# Patient Record
Sex: Male | Born: 1975 | Race: White | Hispanic: No | Smoking: Never smoker
Health system: Southern US, Community
[De-identification: ages and names within clinical notes are randomized; demographics above are authoritative.]

## PROBLEM LIST (undated history)

## (undated) DIAGNOSIS — F419 Anxiety disorder, unspecified: Secondary | ICD-10-CM

## (undated) DIAGNOSIS — K219 Gastro-esophageal reflux disease without esophagitis: Secondary | ICD-10-CM

## (undated) DIAGNOSIS — G4700 Insomnia, unspecified: Secondary | ICD-10-CM

## (undated) DIAGNOSIS — IMO0001 Reserved for inherently not codable concepts without codable children: Secondary | ICD-10-CM

## (undated) DIAGNOSIS — I10 Essential (primary) hypertension: Secondary | ICD-10-CM

## (undated) HISTORY — DX: Essential (primary) hypertension: I10

## (undated) HISTORY — PX: KNEE SURGERY: SHX244

## (undated) HISTORY — DX: Reserved for inherently not codable concepts without codable children: IMO0001

## (undated) HISTORY — DX: Gastro-esophageal reflux disease without esophagitis: K21.9

## (undated) HISTORY — DX: Insomnia, unspecified: G47.00

## (undated) HISTORY — DX: Anxiety disorder, unspecified: F41.9

---

## 2010-06-04 ENCOUNTER — Emergency Department (HOSPITAL_COMMUNITY): Admission: EM | Admit: 2010-06-04 | Discharge: 2010-06-04 | Payer: Self-pay | Admitting: Emergency Medicine

## 2011-01-08 ENCOUNTER — Encounter: Payer: Self-pay | Admitting: Family Medicine

## 2011-09-22 ENCOUNTER — Other Ambulatory Visit: Payer: Self-pay | Admitting: Family Medicine

## 2011-09-22 ENCOUNTER — Ambulatory Visit (HOSPITAL_COMMUNITY)
Admission: RE | Admit: 2011-09-22 | Discharge: 2011-09-22 | Disposition: A | Discharge status: Home / Self Care | Payer: BC Managed Care – PPO | Source: Ambulatory Visit | Attending: Family Medicine | Admitting: Family Medicine

## 2011-09-22 DIAGNOSIS — R0781 Pleurodynia: Secondary | ICD-10-CM

## 2011-09-22 DIAGNOSIS — R109 Unspecified abdominal pain: Secondary | ICD-10-CM | POA: Insufficient documentation

## 2011-09-22 DIAGNOSIS — R079 Chest pain, unspecified: Secondary | ICD-10-CM | POA: Insufficient documentation

## 2012-09-09 ENCOUNTER — Other Ambulatory Visit: Payer: Self-pay | Admitting: Family Medicine

## 2012-09-09 DIAGNOSIS — R945 Abnormal results of liver function studies: Secondary | ICD-10-CM

## 2012-09-12 ENCOUNTER — Ambulatory Visit (HOSPITAL_COMMUNITY)
Admission: RE | Admit: 2012-09-12 | Discharge: 2012-09-12 | Disposition: A | Discharge status: Home / Self Care | Payer: BC Managed Care – PPO | Source: Ambulatory Visit | Attending: Family Medicine | Admitting: Family Medicine

## 2012-09-12 ENCOUNTER — Other Ambulatory Visit: Payer: Self-pay | Admitting: Family Medicine

## 2012-09-12 DIAGNOSIS — R945 Abnormal results of liver function studies: Secondary | ICD-10-CM

## 2012-09-12 DIAGNOSIS — K7689 Other specified diseases of liver: Secondary | ICD-10-CM | POA: Insufficient documentation

## 2012-09-12 DIAGNOSIS — R748 Abnormal levels of other serum enzymes: Secondary | ICD-10-CM | POA: Insufficient documentation

## 2012-10-10 ENCOUNTER — Ambulatory Visit (INDEPENDENT_AMBULATORY_CARE_PROVIDER_SITE_OTHER): Payer: BC Managed Care – PPO | Admitting: Otolaryngology

## 2012-10-10 DIAGNOSIS — J343 Hypertrophy of nasal turbinates: Secondary | ICD-10-CM

## 2012-10-10 DIAGNOSIS — J310 Chronic rhinitis: Secondary | ICD-10-CM

## 2012-10-10 DIAGNOSIS — R51 Headache: Secondary | ICD-10-CM

## 2012-11-21 ENCOUNTER — Ambulatory Visit (INDEPENDENT_AMBULATORY_CARE_PROVIDER_SITE_OTHER): Payer: BC Managed Care – PPO | Admitting: Otolaryngology

## 2012-11-21 DIAGNOSIS — J310 Chronic rhinitis: Secondary | ICD-10-CM

## 2012-11-21 DIAGNOSIS — J343 Hypertrophy of nasal turbinates: Secondary | ICD-10-CM

## 2013-04-23 ENCOUNTER — Encounter: Payer: Self-pay | Admitting: *Deleted

## 2013-04-24 ENCOUNTER — Ambulatory Visit: Payer: Self-pay | Admitting: Family Medicine

## 2013-04-25 ENCOUNTER — Ambulatory Visit (INDEPENDENT_AMBULATORY_CARE_PROVIDER_SITE_OTHER): Payer: BC Managed Care – PPO | Admitting: Nurse Practitioner

## 2013-04-25 ENCOUNTER — Encounter: Payer: Self-pay | Admitting: Nurse Practitioner

## 2013-04-25 VITALS — BP 150/108 | HR 80 | Ht 74.0 in | Wt 278.0 lb

## 2013-04-25 DIAGNOSIS — F411 Generalized anxiety disorder: Secondary | ICD-10-CM

## 2013-04-25 DIAGNOSIS — F419 Anxiety disorder, unspecified: Secondary | ICD-10-CM

## 2013-04-25 DIAGNOSIS — I10 Essential (primary) hypertension: Secondary | ICD-10-CM

## 2013-04-25 MED ORDER — CITALOPRAM HYDROBROMIDE 40 MG PO TABS: 40.0000 mg | ORAL_TABLET | Freq: Every day | ORAL | Status: DC

## 2013-04-25 MED ORDER — LORAZEPAM 1 MG PO TABS: ORAL_TABLET | ORAL | Status: DC

## 2013-04-25 NOTE — Patient Instructions (Signed)
Sodium-Controlled Diet  Sodium is a mineral. It is found in many foods. Sodium may be found naturally or added during the making of a food. The most common form of sodium is salt, which is made up of sodium and chloride. Reducing your sodium intake involves changing your eating habits.  The following guidelines will help you reduce the sodium in your diet:   Stop using the salt shaker.   Use salt sparingly in cooking and baking.   Substitute with sodium-free seasonings and spices.   Do not use a salt substitute (potassium chloride) without your caregiver's permission.   Include a variety of fresh, unprocessed foods in your diet.   Limit the use of processed and convenience foods that are high in sodium.  USE THE FOLLOWING FOODS SPARINGLY:  Breads/Starches   Commercial bread stuffing, commercial pancake or waffle mixes, coating mixes. Waffles. Croutons. Prepared (boxed or frozen) potato, rice, or noodle mixes that contain salt or sodium. Salted French fries or hash browns. Salted popcorn, breads, crackers, chips, or snack foods.  Vegetables   Vegetables canned with salt or prepared in cream, butter, or cheese sauces. Sauerkraut. Tomato or vegetable juices canned with salt.   Fresh vegetables are allowed if rinsed thoroughly.  Fruit   Fruit is okay to eat.  Meat and Meat Substitutes   Salted or smoked meats, such as bacon or Canadian bacon, chipped or corned beef, hot dogs, salt pork, luncheon meats, pastrami, ham, or sausage. Canned or smoked fish, poultry, or meat. Processed cheese or cheese spreads, blue or Roquefort cheese. Battered or frozen fish products. Prepared spaghetti sauce. Baked beans. Reuben sandwiches. Salted nuts. Caviar.  Milk   Limit buttermilk to 1 cup per week.  Soups and Combination Foods    Bouillon cubes, canned or dried soups, broth, consomm. Convenience (frozen or packaged) dinners with more than 600 mg sodium. Pot pies, pizza, Asian food, fast food cheeseburgers, and specialty sandwiches.  Desserts and Sweets   Regular (salted) desserts, pie, commercial fruit snack pies, commercial snack cakes, canned puddings.   Eat desserts and sweets in moderation.  Fats and Oils   Gravy mixes or canned gravy. No more than 1 to 2 tbs of salad dressing. Chip dips.   Eat fats and oils in moderation.  Beverages   See those listed under the vegetables and milk groups.  Condiments   Ketchup, mustard, meat sauces, salsa, regular (salted) and lite soy sauce or mustard. Dill pickles, olives, meat tenderizer. Prepared horseradish or pickle relish. Dutch-processed cocoa. Baking powder or baking soda used medicinally. Worcestershire sauce. "Light" salt. Salt substitute, unless approved by your caregiver.  Document Released: 05/26/2002 Document Revised: 02/26/2012 Document Reviewed: 12/27/2009  Exit[employer] Patient Information 2013 [employer].

## 2013-04-29 ENCOUNTER — Encounter: Payer: Self-pay | Admitting: Nurse Practitioner

## 2013-04-29 DIAGNOSIS — I10 Essential (primary) hypertension: Secondary | ICD-10-CM | POA: Insufficient documentation

## 2013-04-29 DIAGNOSIS — F419 Anxiety disorder, unspecified: Secondary | ICD-10-CM | POA: Insufficient documentation

## 2013-04-29 NOTE — Assessment & Plan Note (Signed)
Continue lorazepam and Celexa as directed.

## 2013-04-29 NOTE — Assessment & Plan Note (Signed)
Continue current meds, recommend nurse visit next week for BP recheck.

## 2013-04-29 NOTE — Progress Notes (Signed)
Subjective:  Presents for routine followup. Sleeping well. Celexa doing well. Also taking Ativan for his anxiety which helps keep his anxiety at a tolerable level for him to function. Had an episode of mid abdominal pain last night, took a Zantac and 20 minutes later improved. Took a second Zantac later, symptoms have resolved. Bowels normal limit. No fever. Did have some slight nausea, no vomiting. Rare reflux depending on his diet. No chest pain shortness of breath or edema. Compliant with medications.  Objective:   BP 150/108  Pulse 80  Ht 6\' 2"  (1.88 m)  Wt 278 lb (126.1 kg)  BMI 35.68 kg/m2 NAD. Alert, oriented. Lungs clear. Heart regular rate rhythm. Lower extremities no edema. BP on recheck sitting 148/106. Abdomen soft nondistended nontender.  Assessment:Anxiety  Hypertension  Plan: Meds ordered this encounter  Medications  . fluticasone (FLONASE) 50 MCG/ACT nasal spray    Sig: Place 2 sprays into the nose daily.  . citalopram (CELEXA) 40 MG tablet    Sig: Take 1 tablet (40 mg total) by mouth daily.    Dispense:  30 tablet    Refill:  5    Order Specific Question:  Supervising Provider    Answer:  Merlyn Albert [[id number]]  . LORazepam (ATIVAN) 1 MG tablet    Sig: One po TID prn anxiety    Dispense:  90 tablet    Refill:  5    5 monthly refills    Order Specific Question:  Supervising Provider    Answer:  Riccardo Dubin   Recommend nurse visit next week for recheck of his blood pressure. Also recommend preventive health physical. Recheck 6 months, call back sooner if any problems.

## 2013-05-05 ENCOUNTER — Other Ambulatory Visit: Payer: Self-pay | Admitting: Nurse Practitioner

## 2013-10-20 ENCOUNTER — Other Ambulatory Visit: Payer: Self-pay | Admitting: Family Medicine

## 2013-10-20 ENCOUNTER — Other Ambulatory Visit: Payer: Self-pay | Admitting: Nurse Practitioner

## 2013-10-22 ENCOUNTER — Telehealth: Payer: Self-pay | Admitting: Family Medicine

## 2013-10-22 MED ORDER — CITALOPRAM HYDROBROMIDE 40 MG PO TABS: ORAL_TABLET | ORAL | Status: DC

## 2013-10-22 NOTE — Telephone Encounter (Signed)
Medication sent to pharmacy. Patient was notified.  

## 2013-10-22 NOTE — Telephone Encounter (Signed)
Patient states he was unaware he would need an office visit to have his medications refilled.  States he has never had to be seen prior to refills.  However, patient has an appointment on Monday November 10/27/2013 @ 1:30pm.  He needs a refill on citalopram (CELEXA) 40 MG tablet [employer]

## 2013-10-27 ENCOUNTER — Ambulatory Visit (INDEPENDENT_AMBULATORY_CARE_PROVIDER_SITE_OTHER): Payer: BC Managed Care – PPO | Admitting: Family Medicine

## 2013-10-27 ENCOUNTER — Encounter: Payer: Self-pay | Admitting: Family Medicine

## 2013-10-27 VITALS — BP 122/90 | Ht 74.0 in | Wt 273.5 lb

## 2013-10-27 DIAGNOSIS — J3089 Other allergic rhinitis: Secondary | ICD-10-CM

## 2013-10-27 DIAGNOSIS — I10 Essential (primary) hypertension: Secondary | ICD-10-CM

## 2013-10-27 DIAGNOSIS — K7689 Other specified diseases of liver: Secondary | ICD-10-CM

## 2013-10-27 DIAGNOSIS — F411 Generalized anxiety disorder: Secondary | ICD-10-CM

## 2013-10-27 DIAGNOSIS — F419 Anxiety disorder, unspecified: Secondary | ICD-10-CM

## 2013-10-27 DIAGNOSIS — J309 Allergic rhinitis, unspecified: Secondary | ICD-10-CM

## 2013-10-27 DIAGNOSIS — K760 Fatty (change of) liver, not elsewhere classified: Secondary | ICD-10-CM

## 2013-10-27 DIAGNOSIS — Z79899 Other long term (current) drug therapy: Secondary | ICD-10-CM

## 2013-10-27 MED ORDER — CITALOPRAM HYDROBROMIDE 40 MG PO TABS: ORAL_TABLET | ORAL | Status: DC

## 2013-10-27 MED ORDER — LORAZEPAM 1 MG PO TABS: ORAL_TABLET | ORAL | Status: DC

## 2013-10-27 MED ORDER — ENALAPRIL MALEATE 10 MG PO TABS: ORAL_TABLET | ORAL | Status: DC

## 2013-10-27 MED ORDER — FLUTICASONE PROPIONATE 50 MCG/ACT NA SUSP: 2.0000 | Freq: Every day | NASAL | Status: DC

## 2013-10-27 NOTE — Progress Notes (Signed)
  Subjective:    Patient ID: Martin Sellers, male    DOB: Mar 30, 1976, 36 y.o.   MRN: [id number]  Hypertension This is a chronic problem. The current episode started more than 1 year ago. The problem is unchanged. The problem is controlled. There are no associated agents to hypertension. There are no known risk factors for coronary artery disease. Treatments tried: enalapril. The current treatment provides moderate improvement. There are no compliance problems.    Patient also has a history of chronic anxiety. He uses both Celexa and anxiolytics for this. He states it definitely helps him.  Patient has history of allergic rhinitis. He uses Flonase year-round. He states that this has controlled his symptoms. No obvious side effects from it.  History of elevated liver function tests. Workup in the past revealed fatty liver. Patient also has an excessive alcohol intake at that time which she is since cut back. Liver enzymes have since normalized.  Patient reports fair compliance with diet. Not exercising much at this time. Unfortunately gaining weight.  Review of Systems No chest pain no back pain no abdominal pain no change in bowel habits or urinary habits    Objective:   Physical Exam Alert obesity present. HEENT mom his congestion blood pressure improved on repeat lungs clear. Heart regular in rhythm. Abdomen benign.       Assessment & Plan:  Impression 1 hypertension good control discussed #2 perennial rhinitis stable. #3 history of fatty liver and elevated liver functions tests status uncertain. #4 obesity discussed. #5 chronic anxiety discussed. Plan maintain medications. Diet exercise discussed. Appropriate blood work. Check every 6 months. WSL

## 2013-10-27 NOTE — Patient Instructions (Signed)
Try to start exercising regularly. We will notify you of your lab reslults

## 2014-03-08 ENCOUNTER — Encounter (HOSPITAL_COMMUNITY): Payer: Self-pay | Admitting: Emergency Medicine

## 2014-03-08 ENCOUNTER — Emergency Department (HOSPITAL_COMMUNITY): Payer: BC Managed Care – PPO

## 2014-03-08 ENCOUNTER — Emergency Department (HOSPITAL_COMMUNITY)
Admission: EM | Admit: 2014-03-08 | Discharge: 2014-03-08 | Disposition: A | Discharge status: Home / Self Care | Payer: BC Managed Care – PPO | Attending: Emergency Medicine | Admitting: Emergency Medicine

## 2014-03-08 DIAGNOSIS — IMO0002 Reserved for concepts with insufficient information to code with codable children: Secondary | ICD-10-CM | POA: Insufficient documentation

## 2014-03-08 DIAGNOSIS — I10 Essential (primary) hypertension: Secondary | ICD-10-CM | POA: Insufficient documentation

## 2014-03-08 DIAGNOSIS — F411 Generalized anxiety disorder: Secondary | ICD-10-CM | POA: Insufficient documentation

## 2014-03-08 DIAGNOSIS — Z79899 Other long term (current) drug therapy: Secondary | ICD-10-CM | POA: Insufficient documentation

## 2014-03-08 DIAGNOSIS — R109 Unspecified abdominal pain: Secondary | ICD-10-CM | POA: Insufficient documentation

## 2014-03-08 DIAGNOSIS — R079 Chest pain, unspecified: Secondary | ICD-10-CM | POA: Insufficient documentation

## 2014-03-08 DIAGNOSIS — R12 Heartburn: Secondary | ICD-10-CM | POA: Insufficient documentation

## 2014-03-08 DIAGNOSIS — Z8719 Personal history of other diseases of the digestive system: Secondary | ICD-10-CM | POA: Insufficient documentation

## 2014-03-08 DIAGNOSIS — E860 Dehydration: Secondary | ICD-10-CM | POA: Insufficient documentation

## 2014-03-08 DIAGNOSIS — R110 Nausea: Secondary | ICD-10-CM | POA: Insufficient documentation

## 2014-03-08 LAB — CBC WITH DIFFERENTIAL/PLATELET
Basophils Absolute: 0 10*3/uL (ref 0.0–0.1)
Basophils Relative: 1 % (ref 0–1)
EOS ABS: 0.1 10*3/uL (ref 0.0–0.7)
EOS PCT: 2 % (ref 0–5)
HEMATOCRIT: 45.7 % (ref 39.0–52.0)
Hemoglobin: 15.4 g/dL (ref 13.0–17.0)
LYMPHS ABS: 1.2 10*3/uL (ref 0.7–4.0)
LYMPHS PCT: 17 % (ref 12–46)
MCH: 31 pg (ref 26.0–34.0)
MCHC: 33.7 g/dL (ref 30.0–36.0)
MCV: 92 fL (ref 78.0–100.0)
MONO ABS: 0.5 10*3/uL (ref 0.1–1.0)
Monocytes Relative: 7 % (ref 3–12)
Neutro Abs: 5 10*3/uL (ref 1.7–7.7)
Neutrophils Relative %: 74 % (ref 43–77)
Platelets: 266 10*3/uL (ref 150–400)
RBC: 4.97 MIL/uL (ref 4.22–5.81)
RDW: 12.2 % (ref 11.5–15.5)
WBC: 6.9 10*3/uL (ref 4.0–10.5)

## 2014-03-08 LAB — CK: Total CK: 560 U/L — ABNORMAL HIGH (ref 7–232)

## 2014-03-08 LAB — URINALYSIS, ROUTINE W REFLEX MICROSCOPIC
Bilirubin Urine: NEGATIVE
Glucose, UA: NEGATIVE mg/dL
Hgb urine dipstick: NEGATIVE
KETONES UR: NEGATIVE mg/dL
LEUKOCYTES UA: NEGATIVE
NITRITE: NEGATIVE
PH: 6 (ref 5.0–8.0)
Protein, ur: NEGATIVE mg/dL
SPECIFIC GRAVITY, URINE: 1.025 (ref 1.005–1.030)
Urobilinogen, UA: 0.2 mg/dL (ref 0.0–1.0)

## 2014-03-08 LAB — I-STAT CG4 LACTIC ACID, ED
LACTIC ACID, VENOUS: 0.82 mmol/L (ref 0.5–2.2)
Lactic Acid, Venous: 3 mmol/L — ABNORMAL HIGH (ref 0.5–2.2)

## 2014-03-08 LAB — I-STAT TROPONIN, ED
TROPONIN I, POC: 0.01 ng/mL (ref 0.00–0.08)
Troponin i, poc: 0 ng/mL (ref 0.00–0.08)

## 2014-03-08 LAB — COMPREHENSIVE METABOLIC PANEL
ALT: 67 U/L — ABNORMAL HIGH (ref 0–53)
AST: 47 U/L — ABNORMAL HIGH (ref 0–37)
Albumin: 4.2 g/dL (ref 3.5–5.2)
Alkaline Phosphatase: 61 U/L (ref 39–117)
BUN: 12 mg/dL (ref 6–23)
CALCIUM: 10 mg/dL (ref 8.4–10.5)
CO2: 25 meq/L (ref 19–32)
CREATININE: 0.9 mg/dL (ref 0.50–1.35)
Chloride: 100 mEq/L (ref 96–112)
GLUCOSE: 128 mg/dL — AB (ref 70–99)
Potassium: 4.3 mEq/L (ref 3.7–5.3)
Sodium: 141 mEq/L (ref 137–147)
TOTAL PROTEIN: 7.4 g/dL (ref 6.0–8.3)
Total Bilirubin: 1.2 mg/dL (ref 0.3–1.2)

## 2014-03-08 LAB — LIPASE, BLOOD: LIPASE: 37 U/L (ref 11–59)

## 2014-03-08 MED ORDER — OXYCODONE-ACETAMINOPHEN 5-325 MG PO TABS
1.0000 | ORAL_TABLET | Freq: Once | ORAL | Status: AC
  Administered 2014-03-08: 1 via ORAL
  Filled 2014-03-08: qty 1

## 2014-03-08 MED ORDER — PANTOPRAZOLE SODIUM 40 MG IV SOLR
40.0000 mg | Freq: Once | INTRAVENOUS | Status: AC
  Administered 2014-03-08: 40 mg via INTRAVENOUS
  Filled 2014-03-08: qty 40

## 2014-03-08 MED ORDER — ONDANSETRON 8 MG PO TBDP
8.0000 mg | ORAL_TABLET | Freq: Once | ORAL | Status: AC
  Administered 2014-03-08: 8 mg via ORAL
  Filled 2014-03-08: qty 1

## 2014-03-08 MED ORDER — IOHEXOL 300 MG/ML  SOLN
50.0000 mL | Freq: Once | INTRAMUSCULAR | Status: AC | PRN
  Administered 2014-03-08: 50 mL via ORAL

## 2014-03-08 MED ORDER — SODIUM CHLORIDE 0.9 % IV BOLUS (SEPSIS): 500.0000 mL | Freq: Once | INTRAVENOUS | Status: DC

## 2014-03-08 MED ORDER — MORPHINE SULFATE 4 MG/ML IJ SOLN
4.0000 mg | Freq: Once | INTRAMUSCULAR | Status: AC
  Administered 2014-03-08: 4 mg via INTRAVENOUS

## 2014-03-08 MED ORDER — IOHEXOL 300 MG/ML  SOLN
100.0000 mL | Freq: Once | INTRAMUSCULAR | Status: AC | PRN
  Administered 2014-03-08: 100 mL via INTRAVENOUS

## 2014-03-08 MED ORDER — MORPHINE SULFATE 4 MG/ML IJ SOLN
INTRAMUSCULAR | Status: AC
  Filled 2014-03-08: qty 1

## 2014-03-08 MED ORDER — SODIUM CHLORIDE 0.9 % IV BOLUS (SEPSIS)
1000.0000 mL | Freq: Once | INTRAVENOUS | Status: AC
  Administered 2014-03-08: 1000 mL via INTRAVENOUS

## 2014-03-08 MED ORDER — ONDANSETRON HCL 4 MG/2ML IJ SOLN
4.0000 mg | Freq: Once | INTRAMUSCULAR | Status: AC
  Administered 2014-03-08: 4 mg via INTRAVENOUS
  Filled 2014-03-08: qty 2

## 2014-03-08 NOTE — ED Notes (Signed)
Discharge instructions reviewed with pt, questions answered. Pt verbalized understanding.  

## 2014-03-08 NOTE — ED Provider Notes (Signed)
CSN: [id number]     Arrival date & time 03/08/14  0140 History   First MD Initiated Contact with Patient 03/08/14 0148     Chief Complaint  Patient presents with  . Flank Pain      Patient is a 38 y.o. male presenting with chest pain. The history is provided by the patient.  Chest Pain Pain location:  R lateral chest Pain quality: pressure   Pain radiates to:  Does not radiate Pain severity:  Moderate Onset quality:  Sudden Duration:  2 hours Timing:  Constant Progression:  Unchanged Relieved by:  None tried Worsened by:  Nothing tried Associated symptoms: abdominal pain, heartburn and nausea   Associated symptoms: no cough, no fever, no lower extremity edema, no numbness, no shortness of breath, no syncope and not vomiting   Risk factors: hypertension   Risk factors: no coronary artery disease, no prior DVT/PE and no smoking   pt reports that he began having right lateral chest pain.  This occurred at rest.  No trauma/falls reported No fever/vomiting He also reports diffuse abdominal pain at this time He also reports onset of "hearburn" tonight as well No left sided CP No SOB No focal arm/leg weakness He has never had this before  He denies h/o CAD/PE/DVT He has h/o HTN  Past Medical History  Diagnosis Date  . Insomnia   . Reflux   . Hypertension   . Anxiety    Past Surgical History  Procedure Laterality Date  . Knee surgery     Family History  Problem Relation Age of Onset  . Hypertension Mother    History  Substance Use Topics  . Smoking status: Never Smoker   . Smokeless tobacco: Not on file  . Alcohol Use: Yes    Review of Systems  Constitutional: Negative for fever.  Respiratory: Negative for cough and shortness of breath.   Cardiovascular: Positive for chest pain. Negative for syncope.  Gastrointestinal: Positive for heartburn, nausea and abdominal pain. Negative for vomiting and blood in stool.  Neurological: Negative for syncope and numbness.   All other systems reviewed and are negative.      Allergies  Review of patient's allergies indicates no known allergies.  Home Medications   Current Outpatient Rx  Name  Route  Sig  Dispense  Refill  . citalopram (CELEXA) 40 MG tablet      TAKE 1 TABLET BY MOUTH DAILY.   30 tablet   5   . enalapril (VASOTEC) 10 MG tablet      TAKE (1) TABLET BY MOUTH DAILY.   30 tablet   5   . LORazepam (ATIVAN) 1 MG tablet      One po TID prn anxiety   90 tablet   5     5 monthly refills   . fluticasone (FLONASE) 50 MCG/ACT nasal spray   Each Nare   Place 2 sprays into both nostrils daily.   16 g   5    BP 152/100  Pulse 98  Temp(Src) 97.6 F (36.4 C) (Oral)  Resp 20  Ht 6\' 2"  (1.88 m)  Wt 250 lb (113.399 kg)  BMI 32.08 kg/m2  SpO2 100% Physical Exam CONSTITUTIONAL: Well developed/well nourished, uncomfortable appearing HEAD: Normocephalic/atraumatic EYES: EOMI/PERRL, no icterus ENMT: Mucous membranes moist NECK: supple no meningeal signs SPINE:entire spine nontender, No bruising/crepitance/stepoffs noted to spine CV: S1/S2 noted, no murmurs/rubs/gallops noted LUNGS: Lungs are clear to auscultation bilaterally, no apparent distress Chest - mild tenderness along right  lateral ribs.  No signs of trauma/bruising.  No crepitus ABDOMEN: soft, diffuse mild tenderness, no rebound or guarding GU:no cva tenderness NEURO: Pt is awake/alert, moves all extremitiesx4, no focal motor weakness noted in his lower extremities. He is able to ambulate EXTREMITIES: pulses normal/equal x4, full ROM SKIN: warm, color normal PSYCH: mildly anxious  ED Course  Procedures  3:14 AM Pt presents for multiple complaints, but appears uncomfortable His pain started in right latera ribs, now has abd pain Will treat pain, check labs/imaging and reassess Will follow closely Initial EKG and troponin are negative 3:51 AM Pt reports epigastric pain that radiates into lower abdomen He has  diffuse abd tenderness He has elevated lactate Will obtain CT abd/pelvis He reports his rib pain is improved 7:02 AM BP 155/63  Pulse 76  Temp(Src) 97.6 F (36.4 C) (Oral)  Resp 16  Ht 6\' 2"  (1.88 m)  Wt 250 lb (113.399 kg)  BMI 32.08 kg/m2  SpO2 97% Pt feels improved He is in no distress, sleeping on recheck He denies any chest or abdominal pain His lactate has cleared and is now normal He feels well for d/c home   I doubt ACS (ekg/troponin not c/w ACS) I doubt PE as cause of his chest pain Also, unlikely to be aortic dissection  It is possible he has biliary colic (gallbladder not completely visualized by CT imaging) but since he is back to baseline will defer further workup.  I advised close PCP followup and outpatient ultrasound  Return precautions given to patient  Labs Review Labs Reviewed  COMPREHENSIVE METABOLIC PANEL - Abnormal; Notable for the following:    Glucose, Bld 128 (*)    AST 47 (*)    ALT 67 (*)    All other components within normal limits  CK - Abnormal; Notable for the following:    Total CK 560 (*)    All other components within normal limits  I-STAT CG4 LACTIC ACID, ED - Abnormal; Notable for the following:    Lactic Acid, Venous 3.00 (*)    All other components within normal limits  URINALYSIS, ROUTINE W REFLEX MICROSCOPIC  CBC WITH DIFFERENTIAL  LIPASE, BLOOD  I-STAT TROPOININ, ED   Imaging Review Ct Abdomen Pelvis W Contrast  03/08/2014   CLINICAL DATA:  Acute onset right flank pain and right posterior chest pain approximately 2 hr prior to emergency department arrival.  EXAM: CT ABDOMEN AND PELVIS WITH CONTRAST  TECHNIQUE: Multidetector CT imaging of the abdomen and pelvis was performed using the standard protocol following bolus administration of intravenous contrast.  CONTRAST:  OMNIPAQUE IOHEXOL 300 MG/ML IV. Oral contrast was also administered.  COMPARISON:  None.  FINDINGS: Normal-appearing liver, spleen, pancreas, adrenal glands,  and right kidney. 1.5 cm simple cyst arising from the upper pole of the otherwise normal left kidney. No urinary tract calculi on either side. No visible aortoiliofemoral atherosclerosis. No significant lymphadenopathy.  Stomach normal in appearance, distended with the oral contrast. Inspissated stool like material over a several cm segment of the distal and terminal ileum. Small bowel otherwise normal. Descending and sigmoid colon diverticulosis without evidence of acute diverticulitis. Remainder of the colon normal in appearance. Normal appendix in the right mid and lower pelvis. No ascites.  Urinary bladder unremarkable. Prostate gland and seminal vesicles normal for age. Phleboliths low in the right side of the pelvis.  Bone window images demonstrate bilateral L5 pars defects without slip and mild degenerative disc disease and spondylosis at L3-4. Visualized lung  bases clear. Heart size upper normal.  IMPRESSION: 1. No acute abnormalities involving the abdomen or pelvis. 2. Inspissated stool like material over a several cm segment of the distal and terminal ileum consistent with stasis. 3. Descending and sigmoid colon diverticulosis without evidence of acute diverticulitis. 4. 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   Electronically Signed   By: Hulan Saashomas  Lawrence M.D.   On: 03/08/2014 06:14   Dg Chest Portable 1 View  03/08/2014   CLINICAL DATA:  Acute onset severe right posterior chest pain approximately 2 hr prior to the emergency department parietal.  EXAM: PORTABLE CHEST - 1 VIEW  COMPARISON:  DG CHEST 2 VIEW dated 09/22/2011  FINDINGS: Suboptimal inspiration accounts for crowded bronchovascular markings, especially in the bases, and accentuates the cardiac silhouette. Taking this into account, cardiac silhouette mildly enlarged but stable. Lungs clear. Bronchovascular markings normal. Pulmonary vascularity normal. No visible pleural effusions. No pneumothorax.  IMPRESSION: Suboptimal inspiration. Stable  mild cardiomegaly. No acute cardiopulmonary disease.   Electronically Signed   By: Hulan Saashomas  Lawrence M.D.   On: 03/08/2014 03:50     EKG Interpretation   Date/Time:  Sunday March 08 2014 02:12:53 EDT Ventricular Rate:  56 PR Interval:  144 QRS Duration: 94 QT Interval:  434 QTC Calculation: 418 R Axis:   58 Text Interpretation:  Sinus bradycardia Otherwise normal ECG No previous  ECGs available artifact noted Confirmed by Bebe ShaggyWICKLINE  MD, Berlin Mokry ([id number]) on  03/08/2014 2:21:22 AM      Date: 03/08/2014 0348am  Rate: 58  Rhythm: sinus bradycardia  QRS Axis: normal  Intervals: normal  ST/T Wave abnormalities: no acute ST change  Conduction Disutrbances:none  Narrative Interpretation:   Old EKG Reviewed: unchanged from earlier (0212am)   MDM   Final diagnoses:  Abdominal pain  Dehydration    Nursing notes including past medical history and social history reviewed and considered in documentation Labs/vital reviewed and considered xrays reviewed and considered Previous records reviewed and considered - previous Xray results reviewed     Joya Gaskinsonald W Mohmed Farver, MD 03/08/14 0710

## 2014-03-08 NOTE — ED Notes (Signed)
Pt states he started having severe sharp right posterior rib and flank pain x 2 hours. Pt is nauseated, no vomiting.

## 2014-03-08 NOTE — Discharge Instructions (Signed)

## 2014-04-11 IMAGING — CR DG CHEST 1V PORT
1 series · 1 of 1 positions shown · non-contrast
Comparison: DG CHEST 2 VIEW dated 09/22/2011

CLINICAL DATA: Acute onset severe right posterior chest pain
approximately 2 hr prior to the emergency department parietal.

EXAM:
PORTABLE CHEST - 1 VIEW

[portable]
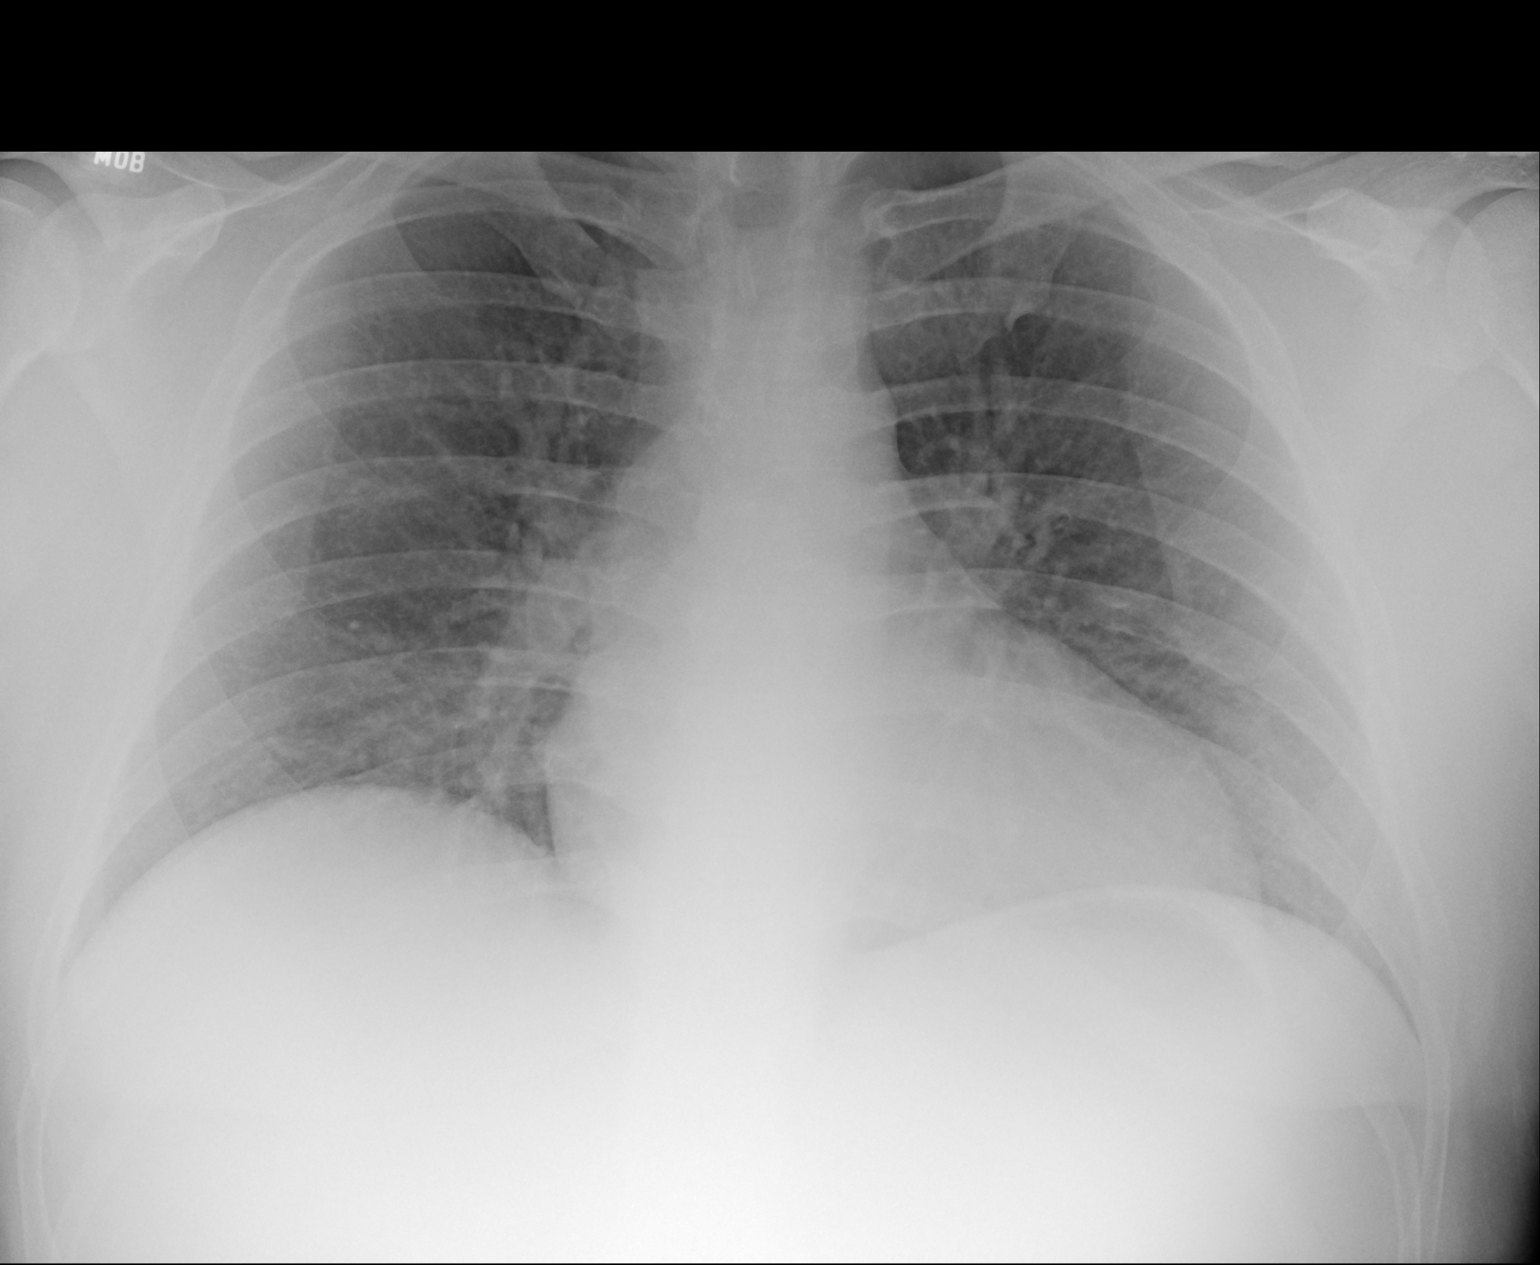

[1 of 1 positions shown; findings below may reference images not displayed]

FINDINGS: Suboptimal inspiration accounts for crowded bronchovascular
markings, especially in the bases, and accentuates the cardiac
silhouette. Taking this into account, cardiac silhouette mildly
enlarged but stable. Lungs clear. Bronchovascular markings normal.
Pulmonary vascularity normal. No visible pleural effusions. No
pneumothorax.
IMPRESSION: Suboptimal inspiration. Stable mild cardiomegaly. No acute
cardiopulmonary disease.

## 2014-04-28 ENCOUNTER — Other Ambulatory Visit: Payer: Self-pay | Admitting: Family Medicine

## 2014-04-29 ENCOUNTER — Telehealth: Payer: Self-pay | Admitting: Family Medicine

## 2014-04-29 NOTE — Telephone Encounter (Signed)
Spoke with [facility] they have rx it is filled and waiting for pick up. Patient notified.

## 2014-04-29 NOTE — Telephone Encounter (Signed)
Patient says that [facility] told him that they did not receive the prescription for enalapril that we sent over yesterday. Can we resend please?

## 2014-05-05 ENCOUNTER — Ambulatory Visit (INDEPENDENT_AMBULATORY_CARE_PROVIDER_SITE_OTHER): Payer: BC Managed Care – PPO | Admitting: Family Medicine

## 2014-05-05 ENCOUNTER — Encounter: Payer: Self-pay | Admitting: Family Medicine

## 2014-05-05 VITALS — BP 132/94 | Ht 74.0 in | Wt 226.0 lb

## 2014-05-05 DIAGNOSIS — F419 Anxiety disorder, unspecified: Secondary | ICD-10-CM

## 2014-05-05 DIAGNOSIS — J309 Allergic rhinitis, unspecified: Secondary | ICD-10-CM

## 2014-05-05 DIAGNOSIS — K7689 Other specified diseases of liver: Secondary | ICD-10-CM

## 2014-05-05 DIAGNOSIS — Z79899 Other long term (current) drug therapy: Secondary | ICD-10-CM

## 2014-05-05 DIAGNOSIS — J3089 Other allergic rhinitis: Secondary | ICD-10-CM

## 2014-05-05 DIAGNOSIS — B351 Tinea unguium: Secondary | ICD-10-CM

## 2014-05-05 DIAGNOSIS — I10 Essential (primary) hypertension: Secondary | ICD-10-CM

## 2014-05-05 DIAGNOSIS — F411 Generalized anxiety disorder: Secondary | ICD-10-CM

## 2014-05-05 DIAGNOSIS — K760 Fatty (change of) liver, not elsewhere classified: Secondary | ICD-10-CM

## 2014-05-05 DIAGNOSIS — E785 Hyperlipidemia, unspecified: Secondary | ICD-10-CM

## 2014-05-05 MED ORDER — CITALOPRAM HYDROBROMIDE 40 MG PO TABS: ORAL_TABLET | ORAL | Status: DC

## 2014-05-05 MED ORDER — LORAZEPAM 1 MG PO TABS: ORAL_TABLET | ORAL | Status: DC

## 2014-05-05 MED ORDER — ENALAPRIL MALEATE 10 MG PO TABS: ORAL_TABLET | ORAL | Status: DC

## 2014-05-05 MED ORDER — TERBINAFINE HCL 250 MG PO TABS: 250.0000 mg | ORAL_TABLET | Freq: Every day | ORAL | Status: DC
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
## 2014-05-06 LAB — HEPATIC FUNCTION PANEL
ALBUMIN: 4.3 g/dL (ref 3.5–5.2)
ALT: 12 U/L (ref 0–53)
AST: 14 U/L (ref 0–37)
Alkaline Phosphatase: 53 U/L (ref 39–117)
Bilirubin, Direct: 0.4 mg/dL — ABNORMAL HIGH (ref 0.0–0.3)
Indirect Bilirubin: 1.8 mg/dL — ABNORMAL HIGH (ref 0.2–1.2)
Total Bilirubin: 2.2 mg/dL — ABNORMAL HIGH (ref 0.2–1.2)
Total Protein: 6.4 g/dL (ref 6.0–8.3)

## 2014-05-06 LAB — BASIC METABOLIC PANEL
BUN: 14 mg/dL (ref 6–23)
CHLORIDE: 101 meq/L (ref 96–112)
CO2: 28 mEq/L (ref 19–32)
Calcium: 9.7 mg/dL (ref 8.4–10.5)
Creat: 0.87 mg/dL (ref 0.50–1.35)
Glucose, Bld: 88 mg/dL (ref 70–99)
POTASSIUM: 4.2 meq/L (ref 3.5–5.3)
Sodium: 137 mEq/L (ref 135–145)

## 2014-05-06 LAB — LIPID PANEL
Cholesterol: 148 mg/dL (ref 0–200)
HDL: 51 mg/dL (ref >39)
LDL CALC: 82 mg/dL (ref 0–99)
Total CHOL/HDL Ratio: 2.9 Ratio
Triglycerides: 77 mg/dL (ref <150)
VLDL: 15 mg/dL (ref 0–40)

## 2014-05-07 ENCOUNTER — Telehealth: Payer: Self-pay | Admitting: Family Medicine

## 2014-05-07 NOTE — Telephone Encounter (Signed)
Patient needs orders for results.

## 2014-05-07 NOTE — Telephone Encounter (Signed)
Had bloodwork on 05/05/14

## 2014-05-11 DIAGNOSIS — B351 Tinea unguium: Secondary | ICD-10-CM | POA: Insufficient documentation

## 2014-05-13 ENCOUNTER — Other Ambulatory Visit: Payer: Self-pay | Admitting: *Deleted

## 2014-05-13 DIAGNOSIS — Z79899 Other long term (current) drug therapy: Secondary | ICD-10-CM

## 2014-05-13 NOTE — Progress Notes (Signed)
Patient notified and verbalized understanding. 

## 2014-05-19 ENCOUNTER — Telehealth: Payer: Self-pay | Admitting: Family Medicine

## 2014-05-19 NOTE — Telephone Encounter (Signed)
See my message on folder top sheet

## 2014-05-19 NOTE — Telephone Encounter (Signed)
Rx prior auth request DENIED for pt's LAMISIL (terbinafin Hcl), please see denial in green folder, please advise

## 2014-05-20 NOTE — Telephone Encounter (Signed)
This is coming back to me again, pt's coverage NOT indicated by his insur companies criteria. Pt will have to purchase med himself if he is to take, there is no room for appeal by their criteria, someone inform pt of that

## 2014-06-02 NOTE — Telephone Encounter (Signed)
Tried to call pt to explain that Rx was denied and he'll have to pay out of pocket.  No answer at home or cell# and no answering machine or voicemail available to leave a message

## 2014-07-17 LAB — HEPATIC FUNCTION PANEL
ALK PHOS: 59 U/L (ref 39–117)
ALT: 22 U/L (ref 0–53)
AST: 62 U/L — AB (ref 0–37)
Albumin: 4.5 g/dL (ref 3.5–5.2)
BILIRUBIN DIRECT: 0.3 mg/dL (ref 0.0–0.3)
BILIRUBIN INDIRECT: 1.4 mg/dL — AB (ref 0.2–1.2)
TOTAL PROTEIN: 6.8 g/dL (ref 6.0–8.3)
Total Bilirubin: 1.7 mg/dL — ABNORMAL HIGH (ref 0.2–1.2)

## 2014-07-17 NOTE — Progress Notes (Signed)
Patient notified and verbalized understanding of the test results. No further questions. 

## 2014-08-14 ENCOUNTER — Other Ambulatory Visit: Payer: Self-pay | Admitting: Family Medicine

## 2014-11-02 ENCOUNTER — Other Ambulatory Visit: Payer: Self-pay | Admitting: Family Medicine

## 2014-11-02 NOTE — Telephone Encounter (Signed)
One mo, ov before further

## 2014-11-17 ENCOUNTER — Other Ambulatory Visit: Payer: Self-pay | Admitting: Family Medicine

## 2014-11-30 ENCOUNTER — Other Ambulatory Visit: Payer: Self-pay | Admitting: Family Medicine

## 2014-11-30 NOTE — Telephone Encounter (Signed)
One mo only of all, needs visit bef ore further, plz sched when calling pt

## 2014-11-30 NOTE — Telephone Encounter (Signed)
Last seen 05/05/14

## 2014-12-29 ENCOUNTER — Other Ambulatory Visit: Payer: Self-pay | Admitting: Family Medicine

## 2014-12-31 ENCOUNTER — Other Ambulatory Visit: Payer: Self-pay | Admitting: Family Medicine

## 2014-12-31 NOTE — Telephone Encounter (Signed)
One mo worth needs o v 

## 2015-01-01 ENCOUNTER — Telehealth: Payer: Self-pay | Admitting: Family Medicine

## 2015-01-01 NOTE — Telephone Encounter (Signed)
Pt is requesting a refill on LORazepam. Pt has an upcoming appt  On 01/07/15. Pt is needing enough to last till his appt.

## 2015-01-01 NOTE — Telephone Encounter (Signed)
Rx faxed to pharmacy. Patient notified. 

## 2015-01-07 ENCOUNTER — Encounter: Payer: Self-pay | Admitting: Family Medicine

## 2015-01-07 ENCOUNTER — Ambulatory Visit (INDEPENDENT_AMBULATORY_CARE_PROVIDER_SITE_OTHER): Payer: BC Managed Care – PPO | Admitting: Family Medicine

## 2015-01-07 VITALS — BP 112/74 | Ht 74.0 in | Wt 214.0 lb

## 2015-01-07 DIAGNOSIS — F419 Anxiety disorder, unspecified: Secondary | ICD-10-CM

## 2015-01-07 DIAGNOSIS — G4700 Insomnia, unspecified: Secondary | ICD-10-CM | POA: Insufficient documentation

## 2015-01-07 DIAGNOSIS — M79644 Pain in right finger(s): Secondary | ICD-10-CM

## 2015-01-07 DIAGNOSIS — S6000XA Contusion of unspecified finger without damage to nail, initial encounter: Secondary | ICD-10-CM

## 2015-01-07 DIAGNOSIS — J3089 Other allergic rhinitis: Secondary | ICD-10-CM

## 2015-01-07 DIAGNOSIS — I10 Essential (primary) hypertension: Secondary | ICD-10-CM

## 2015-01-07 DIAGNOSIS — J309 Allergic rhinitis, unspecified: Secondary | ICD-10-CM

## 2015-01-07 MED ORDER — CITALOPRAM HYDROBROMIDE 40 MG PO TABS: 40.0000 mg | ORAL_TABLET | Freq: Every day | ORAL | Status: DC

## 2015-01-07 MED ORDER — LORAZEPAM 1 MG PO TABS: ORAL_TABLET | ORAL | Status: DC

## 2015-01-07 MED ORDER — ENALAPRIL MALEATE 10 MG PO TABS: ORAL_TABLET | ORAL | Status: DC

## 2015-01-07 NOTE — Progress Notes (Signed)
   Subjective:    Patient ID: Martin Sellers, male    DOB: 1976/10/16, 39 y.o.   MRN: [id number]  HPI Patient is here today for a check up. Patient arrives office for multiple concerns.  Claims compliance with blood pressure medicine. No obvious side effects. Watching salt intake. Exercising quite a bit.  Ongoing stress and anxiety. In separation with wife and heading towards divorce. A lot of stress on the patient. Ongoing trouble sleeping.  Notes numbness of distal right fourth finger. Recalls significant pressure injury   He needs a refill on his meds.  No concerns.     Review of Systems No headache no chest pain no back pain no abdominal pain no change in bowel habits no blood in stool ROS otherwise negative    Objective:   Physical Exam Alert no acute distress. Vitals stable. Blood pressure good on repeat. HEENT in T normal. Lungs clear. Heart regular in rhythm. Distal finger sensation intact currently. Capillary refill good no deformity       Assessment & Plan:  Impression 1 hypertension good control #2 insomnia ongoing #3 chronic depression anxiety stable #4 neuropraxia of finger discussed land diet exercise discussed. Medications refilled. Reassurance regarding finger. Check in 6 months. WSL

## 2015-02-02 DIAGNOSIS — Z029 Encounter for administrative examinations, unspecified: Secondary | ICD-10-CM

## 2015-03-05 ENCOUNTER — Other Ambulatory Visit: Payer: Self-pay | Admitting: Family Medicine

## 2015-06-11 ENCOUNTER — Other Ambulatory Visit: Payer: Self-pay | Admitting: Family Medicine

## 2015-07-07 ENCOUNTER — Other Ambulatory Visit: Payer: Self-pay | Admitting: Family Medicine

## 2015-07-07 NOTE — Telephone Encounter (Signed)
May allow for this plus one additional refill, needs office visit

## 2015-07-30 ENCOUNTER — Other Ambulatory Visit: Payer: Self-pay | Admitting: Family Medicine

## 2015-07-30 MED ORDER — LORAZEPAM 1 MG PO TABS: ORAL_TABLET | ORAL | Status: DC

## 2015-07-30 NOTE — Telephone Encounter (Signed)
I believe this is already handled? If so please remove from my file

## 2015-07-30 NOTE — Telephone Encounter (Signed)
LORazepam (ATIVAN) 1 MG tablet  Pt is out of this med, wishes to NOT wait till Dr Brett Canales is back To get this refilled please. Should be a refill message in the messages From the pharmacy.   Call when ready

## 2015-07-30 NOTE — Telephone Encounter (Signed)
Last seen jan 2016

## 2015-07-30 NOTE — Telephone Encounter (Signed)
Notified patient script faxed to pharmacy. 1 refill per Dr. Lorin Picket, needs office visit. Patient verbalized understanding.

## 2015-08-11 ENCOUNTER — Other Ambulatory Visit: Payer: Self-pay | Admitting: Family Medicine

## 2015-08-11 NOTE — Telephone Encounter (Signed)
30 d no ref, needs six mo appt

## 2015-09-01 ENCOUNTER — Telehealth: Payer: Self-pay | Admitting: Family Medicine

## 2015-09-01 MED ORDER — LORAZEPAM 1 MG PO TABS: ORAL_TABLET | ORAL | Status: DC

## 2015-09-01 NOTE — Telephone Encounter (Signed)
Rx faxed to pharmacy. Patient notified. 

## 2015-09-01 NOTE — Telephone Encounter (Signed)
Pt is needing a refill on his lorazepam. Pt has an appt scheduled for next wed.  [employer]

## 2015-09-01 NOTE — Telephone Encounter (Signed)
30 days worth

## 2015-09-08 ENCOUNTER — Encounter: Payer: Self-pay | Admitting: Family Medicine

## 2015-09-08 ENCOUNTER — Ambulatory Visit (INDEPENDENT_AMBULATORY_CARE_PROVIDER_SITE_OTHER): Payer: BC Managed Care – PPO | Admitting: Family Medicine

## 2015-09-08 VITALS — BP 136/82 | Ht 74.0 in | Wt 213.0 lb

## 2015-09-08 DIAGNOSIS — G4700 Insomnia, unspecified: Secondary | ICD-10-CM | POA: Diagnosis not present

## 2015-09-08 DIAGNOSIS — I10 Essential (primary) hypertension: Secondary | ICD-10-CM | POA: Diagnosis not present

## 2015-09-08 DIAGNOSIS — J329 Chronic sinusitis, unspecified: Secondary | ICD-10-CM

## 2015-09-08 DIAGNOSIS — F419 Anxiety disorder, unspecified: Secondary | ICD-10-CM | POA: Diagnosis not present

## 2015-09-08 MED ORDER — ENALAPRIL MALEATE 10 MG PO TABS: ORAL_TABLET | ORAL | Status: DC

## 2015-09-08 MED ORDER — AMOXICILLIN-POT CLAVULANATE 875-125 MG PO TABS: 1.0000 | ORAL_TABLET | Freq: Two times a day (BID) | ORAL | Status: DC

## 2015-09-08 MED ORDER — CITALOPRAM HYDROBROMIDE 40 MG PO TABS: 40.0000 mg | ORAL_TABLET | Freq: Every day | ORAL | Status: DC

## 2015-09-08 MED ORDER — LORAZEPAM 1 MG PO TABS: ORAL_TABLET | ORAL | Status: DC

## 2015-09-08 MED ORDER — ALBUTEROL SULFATE HFA 108 (90 BASE) MCG/ACT IN AERS: 2.0000 | INHALATION_SPRAY | Freq: Four times a day (QID) | RESPIRATORY_TRACT | Status: DC | PRN

## 2015-09-08 NOTE — Progress Notes (Signed)
   Subjective:    Patient ID: [name], male    DOB: [birth year], 39 y.o.   MRN: [id number] Patient arrives office with 3 distinct problems HPI Patient arrives for a follow up on blood pressure. See below     and anxiety. Patient currently on celexa, ativan . States she definitely needs Ativan. He has back down to 2 tablets per day still on the Celexa definitely needs it. A lot of stress with recent divorce   and vasotec takes regularly.  , compliant with the med tries not to miss any doses. Has cut down salt. Decent appetite. Exercising a bit but not as much as he hoped   Patient also having congestion and cough- daughter recently seen for similar illness., No fever no chills. Frontal headache study in nature worse with increased pressure   POS PRICUCTIVE, GUNKY, MUCINEX   Prod gunky at times    Review of Systems No chest pain positive headache no back pain no abdominal pain no change in bowel habits no blood in stool    Objective:   Physical Exam Alert vitals stable HEENT moderate nasal congestion frontal tenderness pharynx normal neck supple. Blood pressure good on repeat. Lungs clear heart regular in rhythm.       Assessment & Plan:  Impression 1 acute rhinosinusitis plan antibiotics prescribed. Symptomatic care discussed. Warning signs discussed WSL #2 hypertension good control discussed length will maintain same medications. #3 depression anxiety clinically stable. States deftly benefiting from medications will maintain same exercise encouraged to help recheck in 6 months

## 2015-12-18 ENCOUNTER — Other Ambulatory Visit: Payer: Self-pay | Admitting: Family Medicine

## 2015-12-21 ENCOUNTER — Other Ambulatory Visit: Payer: Self-pay | Admitting: Family Medicine

## 2015-12-21 MED ORDER — LORAZEPAM 1 MG PO TABS: ORAL_TABLET | ORAL | Status: DC

## 2015-12-21 NOTE — Telephone Encounter (Signed)
Ok , ck and see if been getting monthly if so, ok plus monthly two ref , if none since last visit just write one with no ref

## 2015-12-21 NOTE — Telephone Encounter (Signed)
Patient needs refill ativan 1mg  called into [facility] .States completely out.

## 2015-12-22 ENCOUNTER — Telehealth: Payer: Self-pay | Admitting: Family Medicine

## 2015-12-22 MED ORDER — LORAZEPAM 1 MG PO TABS: ORAL_TABLET | ORAL | Status: DC

## 2015-12-22 NOTE — Telephone Encounter (Signed)
See phone mess

## 2015-12-22 NOTE — Telephone Encounter (Signed)
citalopram (CELEXA) 40 MG tablet    Pt had this script called in today, however it was sent with no refills an told he needs an appt He is not due back according to last OV note till march for his med check. The original script was  Sent to the pharmacy with 5 refills from sept so he should not have ran out at this point. So to not  Make the patient have to run around trying to refill can we call them an add 2 refills to this med   [facility]

## 2015-12-22 NOTE — Addendum Note (Signed)
Addended by: Margaretha SheffieldBROWN, AUTUMN S on: 12/22/2015 10:27 AM   Modules accepted: Orders

## 2015-12-22 NOTE — Telephone Encounter (Signed)
Rx faxed to pharmacy. Patient notified. 

## 2015-12-22 NOTE — Telephone Encounter (Signed)
Patient needs some refills on his Lorazepam (Ativan) not citalopram. Can patient have some refills? He is not due for his 6 month check up until March.

## 2015-12-22 NOTE — Telephone Encounter (Signed)
Needs all meds thru march, puzzling why ativan ran out, call pharm to make sure not extra ones filled

## 2016-03-02 ENCOUNTER — Encounter: Payer: Self-pay | Admitting: Family Medicine

## 2016-03-02 ENCOUNTER — Ambulatory Visit (INDEPENDENT_AMBULATORY_CARE_PROVIDER_SITE_OTHER): Payer: BC Managed Care – PPO | Admitting: Family Medicine

## 2016-03-02 VITALS — BP 122/80 | Ht 74.0 in | Wt 219.0 lb

## 2016-03-02 DIAGNOSIS — Z13220 Encounter for screening for lipoid disorders: Secondary | ICD-10-CM

## 2016-03-02 DIAGNOSIS — I10 Essential (primary) hypertension: Secondary | ICD-10-CM | POA: Diagnosis not present

## 2016-03-02 DIAGNOSIS — Z79899 Other long term (current) drug therapy: Secondary | ICD-10-CM

## 2016-03-02 DIAGNOSIS — F419 Anxiety disorder, unspecified: Secondary | ICD-10-CM

## 2016-03-02 MED ORDER — ENALAPRIL MALEATE 10 MG PO TABS: ORAL_TABLET | ORAL | Status: DC

## 2016-03-02 MED ORDER — LORAZEPAM 1 MG PO TABS: ORAL_TABLET | ORAL | Status: DC

## 2016-03-02 MED ORDER — CITALOPRAM HYDROBROMIDE 40 MG PO TABS: 40.0000 mg | ORAL_TABLET | Freq: Every day | ORAL | Status: DC

## 2016-03-02 NOTE — Progress Notes (Signed)
   Subjective:    Patient ID: Martin Sellers, male    DOB: 08-Feb-1976, 40 y.o.   MRN: [id number]  Hypertension This is a chronic problem. The current episode started more than 1 year ago. There are no compliance problems.    Twice per wk exercising  Needs refills on meds. No concerns.   Not partic drowsy with the ativan    Testing patient arrives office for discussion. Compliant blood pressure medicine. Does not miss a dose. Meds reviewed today.  Compliant anxiety medicine. States she definitely needs at no excess drowsiness on it no headache no chest pain no back pain no abdominal pain no change in bowel habits    Review of Systems     Objective:   Physical Exam Alert vital stable HEENT normal lungs clear heart rare rhythm ankles without edema       Assessment & Plan:  Impression 1 hypertension good control #2 chronic anxiety discussed has had a lot of stresses with recent divorce and now challenges with shared coverage of his daughter plan meds refilled diet exercise discussed recheck in 6 months

## 2016-08-04 ENCOUNTER — Other Ambulatory Visit: Payer: Self-pay | Admitting: Family Medicine

## 2016-10-02 ENCOUNTER — Other Ambulatory Visit: Payer: Self-pay | Admitting: Family Medicine

## 2016-10-02 NOTE — Telephone Encounter (Signed)
30 d worth both, o v necessary

## 2016-10-04 ENCOUNTER — Other Ambulatory Visit: Payer: Self-pay | Admitting: Family Medicine

## 2016-10-04 NOTE — Telephone Encounter (Signed)
One mo worth, call pt six mo visit overdue

## 2016-10-24 ENCOUNTER — Other Ambulatory Visit: Payer: Self-pay | Admitting: Family Medicine

## 2016-11-24 ENCOUNTER — Ambulatory Visit (INDEPENDENT_AMBULATORY_CARE_PROVIDER_SITE_OTHER): Payer: BC Managed Care – PPO | Admitting: Nurse Practitioner

## 2016-11-24 VITALS — BP 142/94 | Wt 222.0 lb

## 2016-11-24 DIAGNOSIS — K760 Fatty (change of) liver, not elsewhere classified: Secondary | ICD-10-CM

## 2016-11-24 DIAGNOSIS — I10 Essential (primary) hypertension: Secondary | ICD-10-CM | POA: Diagnosis not present

## 2016-11-24 DIAGNOSIS — F419 Anxiety disorder, unspecified: Secondary | ICD-10-CM

## 2016-11-24 MED ORDER — ENALAPRIL MALEATE 10 MG PO TABS: ORAL_TABLET | ORAL | 1 refills | Status: DC

## 2016-11-24 MED ORDER — CITALOPRAM HYDROBROMIDE 40 MG PO TABS: 40.0000 mg | ORAL_TABLET | Freq: Every day | ORAL | 1 refills | Status: DC

## 2016-11-24 MED ORDER — LORAZEPAM 1 MG PO TABS: 1.0000 mg | ORAL_TABLET | Freq: Two times a day (BID) | ORAL | 5 refills | Status: DC | PRN

## 2016-11-25 ENCOUNTER — Encounter: Payer: Self-pay | Admitting: Nurse Practitioner

## 2016-11-25 NOTE — Progress Notes (Signed)
Subjective:  Presents for routine follow-up. Active. Minimal change in weight. Takes Ativan 1 mg each morning. Does not cause any drowsiness. Doing well on Celexa. No chest pain/ischemic type pain or shortness of breath. No edema. Did not get lab work ordered at last visit. No added sodium to diet.  Objective:   BP (!) 142/94   Wt 222 lb (100.7 kg)   BMI 28.50 kg/m  NAD. Alert, oriented. Lungs clear. Heart regular rate rhythm. BP 142/94 right arm sitting. Lower extremities no edema.  Assessment:  Problem List Items Addressed This Visit      Cardiovascular and Mediastinum   Hypertension - Primary   Relevant Medications   enalapril (VASOTEC) 10 MG tablet   Other Relevant Orders   Basic metabolic panel   Lipid panel     Digestive   Fatty liver   Relevant Orders   Hepatic function panel   Lipid panel     Other   Anxiety   Relevant Medications   citalopram (CELEXA) 40 MG tablet   LORazepam (ATIVAN) 1 MG tablet     Plan:  Meds ordered this encounter  Medications  . citalopram (CELEXA) 40 MG tablet    Sig: Take 1 tablet (40 mg total) by mouth daily.    Dispense:  90 tablet    Refill:  1    Order Specific Question:   Supervising Provider    Answer:   Merlyn AlbertLUKING, WILLIAM S [[id number]]  . enalapril (VASOTEC) 10 MG tablet    Sig: TAKE (1) TABLET BY MOUTH DAILY.    Dispense:  90 tablet    Refill:  1    Order Specific Question:   Supervising Provider    Answer:   Merlyn AlbertLUKING, WILLIAM S [[id number]]  . LORazepam (ATIVAN) 1 MG tablet    Sig: Take 1 tablet (1 mg total) by mouth 2 (two) times daily as needed. for anxiety    Dispense:  60 tablet    Refill:  5    May refill monthly    Order Specific Question:   Supervising Provider    Answer:   Merlyn AlbertLUKING, WILLIAM S [[id number]]   Discussed importance of healthy diet and regular activity. Strongly encourage patient to get lab work previously ordered. Return in about 6 months (around 05/25/2017) for recheck.

## 2017-02-26 ENCOUNTER — Other Ambulatory Visit: Payer: Self-pay | Admitting: Nurse Practitioner

## 2017-05-24 ENCOUNTER — Other Ambulatory Visit: Payer: Self-pay | Admitting: Nurse Practitioner

## 2017-06-21 ENCOUNTER — Other Ambulatory Visit: Payer: Self-pay | Admitting: Nurse Practitioner

## 2017-06-29 ENCOUNTER — Encounter: Payer: Self-pay | Admitting: Family Medicine

## 2017-06-29 ENCOUNTER — Ambulatory Visit (INDEPENDENT_AMBULATORY_CARE_PROVIDER_SITE_OTHER): Payer: BC Managed Care – PPO | Admitting: Family Medicine

## 2017-06-29 VITALS — BP 124/86 | Ht 74.0 in | Wt 237.0 lb

## 2017-06-29 DIAGNOSIS — I10 Essential (primary) hypertension: Secondary | ICD-10-CM | POA: Diagnosis not present

## 2017-06-29 DIAGNOSIS — Z79899 Other long term (current) drug therapy: Secondary | ICD-10-CM

## 2017-06-29 DIAGNOSIS — Z13220 Encounter for screening for lipoid disorders: Secondary | ICD-10-CM

## 2017-06-29 MED ORDER — LORAZEPAM 1 MG PO TABS: ORAL_TABLET | ORAL | 5 refills | Status: DC

## 2017-06-29 MED ORDER — CITALOPRAM HYDROBROMIDE 40 MG PO TABS: 40.0000 mg | ORAL_TABLET | Freq: Every day | ORAL | 1 refills | Status: DC

## 2017-06-29 MED ORDER — ENALAPRIL MALEATE 10 MG PO TABS: ORAL_TABLET | ORAL | 1 refills | Status: DC

## 2017-06-29 NOTE — Progress Notes (Signed)
   Subjective:    Patient ID: Martin DolphinJohn Sellers, male    DOB: 1976-10-12, 41 y.o.   MRN: [id number]  Hypertension  This is a recurrent problem. The current episode started more than 1 year ago. The problem is unchanged. The problem is controlled. The current treatment provides significant improvement.   Pt states he eats healthy and exercises occasionally.  No other concerns.  Blood pressure medicine and blood pressure levels reviewed today with patient. Compliant with blood pressure medicine. States does not miss a dose. No obvious side effects. Blood pressure generally good when checked elsewhere. Watching salt intake.  Ongoing challenges with anxiety. States medication definitely helps. No obvious side effects.  Ongoing challenges with insomnia. Discussed.  Patient compliant with insomnia medication. Generally takes most nights. No obvious morning drowsiness. Definitely helps patient sleep. Without it patient states would not get a good nights rest.    Review of Systems No headache, no major weight loss or weight gain, no chest pain no back pain abdominal pain no change in bowel habits complete ROS otherwise negative     Objective:   Physical Exam  Alert vitals stable, NAD. Blood pressure good on repeat. HEENT normal. Lungs clear. Heart regular rate and rhythm.       Assessment & Plan:  Impression 1 hypertension good control discussed maintain same meds #2 anxiety with element of insomnia discussed with ongoing need for meds meds refilled. Diet exercise discussed. Appropriate blood work strongly encouraged to get he

## 2017-12-24 MED ORDER — ENALAPRIL MALEATE 10 MG PO TABS: ORAL_TABLET | ORAL | 1 refills | Status: DC

## 2017-12-24 NOTE — Addendum Note (Signed)
Addended by: Merlyn AlbertLUKING, Yoshika Vensel S on: 12/24/2017 04:07 PM   Modules accepted: Orders

## 2017-12-26 ENCOUNTER — Encounter (HOSPITAL_COMMUNITY): Payer: Self-pay | Admitting: Emergency Medicine

## 2017-12-26 ENCOUNTER — Other Ambulatory Visit: Payer: Self-pay

## 2017-12-26 ENCOUNTER — Emergency Department (HOSPITAL_COMMUNITY)
Admission: EM | Admit: 2017-12-26 | Discharge: 2017-12-26 | Disposition: A | Discharge status: Home / Self Care | Payer: Worker's Compensation | Attending: Emergency Medicine | Admitting: Emergency Medicine

## 2017-12-26 DIAGNOSIS — W540XXA Bitten by dog, initial encounter: Secondary | ICD-10-CM | POA: Diagnosis not present

## 2017-12-26 DIAGNOSIS — I10 Essential (primary) hypertension: Secondary | ICD-10-CM | POA: Diagnosis not present

## 2017-12-26 DIAGNOSIS — Z79899 Other long term (current) drug therapy: Secondary | ICD-10-CM | POA: Insufficient documentation

## 2017-12-26 DIAGNOSIS — Z23 Encounter for immunization: Secondary | ICD-10-CM | POA: Insufficient documentation

## 2017-12-26 DIAGNOSIS — Y9389 Activity, other specified: Secondary | ICD-10-CM | POA: Insufficient documentation

## 2017-12-26 DIAGNOSIS — Y929 Unspecified place or not applicable: Secondary | ICD-10-CM | POA: Diagnosis not present

## 2017-12-26 DIAGNOSIS — S81852A Open bite, left lower leg, initial encounter: Secondary | ICD-10-CM | POA: Insufficient documentation

## 2017-12-26 DIAGNOSIS — Y990 Civilian activity done for income or pay: Secondary | ICD-10-CM | POA: Diagnosis not present

## 2017-12-26 MED ORDER — AMOXICILLIN-POT CLAVULANATE 875-125 MG PO TABS: 1.0000 | ORAL_TABLET | Freq: Two times a day (BID) | ORAL | 0 refills | Status: DC

## 2017-12-26 MED ORDER — LIDOCAINE-EPINEPHRINE (PF) 2 %-1:200000 IJ SOLN
10.0000 mL | Freq: Once | INTRAMUSCULAR | Status: AC
  Administered 2017-12-26: 10 mL via INTRADERMAL
  Filled 2017-12-26: qty 20

## 2017-12-26 MED ORDER — AMOXICILLIN-POT CLAVULANATE 875-125 MG PO TABS
1.0000 | ORAL_TABLET | Freq: Once | ORAL | Status: AC
Start: 2017-12-26 — End: 2017-12-26
  Administered 2017-12-26: 1 via ORAL
  Filled 2017-12-26: qty 1

## 2017-12-26 MED ORDER — TETANUS-DIPHTH-ACELL PERTUSSIS 5-2.5-18.5 LF-MCG/0.5 IM SUSP
0.5000 mL | Freq: Once | INTRAMUSCULAR | Status: AC
  Administered 2017-12-26: 0.5 mL via INTRAMUSCULAR
  Filled 2017-12-26: qty 0.5

## 2017-12-26 NOTE — ED Notes (Signed)
Animal control at bedside

## 2017-12-26 NOTE — ED Triage Notes (Signed)
Pt reports was serving warrant this am and reports two dogs came out and bite pt on left calf and left thigh. Pt reports animal control has already been notified.

## 2017-12-26 NOTE — Discharge Instructions (Signed)
Clean the wound with mild soap and water and keep it bandaged.  You may take Tylenol or ibuprofen if needed for pain.  Apply ice packs on and off to help reduce swelling.  Return here on Friday for wound recheck.  Return sooner for any signs of infection such as increasing pain, fever, chills, redness surrounding the wound or drainage.

## 2017-12-26 NOTE — ED Provider Notes (Signed)
[facility] EMERGENCY DEPARTMENT Provider Note   CSN: [id number] Arrival date & time: 12/26/17  0920     History   Chief Complaint Chief Complaint  Patient presents with  . Animal Bite    HPI Martin Sellers is a 42 y.o. male.  HPI  Martin Sellers is a 42 y.o. male who presents to the Emergency Department complaining of laceration to left thigh and lower leg.  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  Incident occurred just prior to ER arrival.  wounds have not been cleaned.  Complains of soreness to his left thigh.  Last tetanus  vaccine is been more than 5 years.  He states animal control is currently and is determining dog's rabies vaccination status.  He denies numbness, use of blood thinners, or weakness of the extremity.   Past Medical History:  Diagnosis Date  . Anxiety   . Hypertension   . Insomnia   . Reflux     Patient Active Problem List   Diagnosis Date Noted  . Insomnia 01/07/2015  . Onychomycosis 05/11/2014  . Perennial allergic rhinitis 10/27/2013  . Fatty liver 10/27/2013  . Hypertension   . Anxiety     Past Surgical History:  Procedure Laterality Date  . KNEE SURGERY         Home Medications    Prior to Admission medications   Medication Sig Start Date End Date Taking? Authorizing Provider  citalopram (CELEXA) 40 MG tablet Take 1 tablet (40 mg total) by mouth daily. 06/29/17   Merlyn Albert, MD  enalapril (VASOTEC) 10 MG tablet TAKE (1) TABLET BY MOUTH DAILY. 12/24/17   Merlyn Albert, MD  LORazepam (ATIVAN) 1 MG tablet Take one tablet qd as needed for anxiety 06/29/17   Merlyn Albert, MD    Family History Family History  Problem Relation Age of Onset  . Hypertension Mother     Social History Social History   Tobacco Use  . Smoking status: Never Smoker  . Smokeless tobacco: Never Used  Substance Use Topics  . Alcohol use: No    Frequency: Never  . Drug use: Not on file     Allergies   Patient  has no known allergies.   Review of Systems Review of Systems  Constitutional: Negative for chills and fever.  Musculoskeletal: Negative for arthralgias, back pain and joint swelling.  Skin: Positive for wound.       Dog bite x2 to left leg  Neurological: Negative for dizziness, weakness and numbness.  Hematological: Does not bruise/bleed easily.     Physical Exam Updated Vital Signs BP (!) 163/100 (BP Location: Right Arm)   Pulse 97   Temp 98.9 F (37.2 C) (Oral)   Resp 18   Ht 6\' 2"  (1.88 m)   Wt 104.3 kg (230 lb)   SpO2 98%   BMI 29.53 kg/m   Physical Exam  Constitutional: He is oriented to person, place, and time. He appears well-developed and well-nourished. No distress.  HENT:  Head: Normocephalic and atraumatic.  Cardiovascular: Normal rate, regular rhythm and intact distal pulses.  No murmur heard. Pulmonary/Chest: Effort normal and breath sounds normal. No respiratory distress.  Musculoskeletal: Normal range of motion. He exhibits no edema or tenderness.  Neurological: He is alert and oriented to person, place, and time. He exhibits normal muscle tone. Coordination normal.  Skin: Skin is warm. Capillary refill takes less than 2 seconds. Laceration noted.  3 cm laceration to lateral left thigh.  Bleeding control.  There is abrasions and large puncture wound to the lateral left lower leg.  Mild surrounding edema.  Nursing note and vitals reviewed.    ED Treatments / Results  Labs (all labs ordered are listed, but only abnormal results are displayed) Labs Reviewed - No data to display  EKG  EKG Interpretation None       Radiology No results found.  Procedures Procedures (including critical care time)  LACERATION REPAIR Performed by: Tomorrow Dehaas L. Authorized by: Maxwell CaulRIPLETT,Zorion Nims L. Consent: Verbal consent obtained. Risks and benefits: risks, benefits and alternatives were discussed Consent given by: patient Patient identity confirmed:  provided demographic data Prepped and Draped in normal sterile fashion Wound explored  Laceration Location: left thigh   Laceration Length: 3 cm  No Foreign Bodies seen or palpated  Anesthesia: local infiltration  Local anesthetic: lidocaine 2 % w/ epinephrine  Anesthetic total: 2  ml  Irrigation method: syringe Amount of cleaning: standard  Skin closure: 4-0 ethilon  Number of sutures: 2   Technique: simple interrupted (very loosely approximated)  Patient tolerance: Patient tolerated the procedure well with no immediate complications.   Medications Ordered in ED Medications  amoxicillin-clavulanate (AUGMENTIN) 875-125 MG per tablet 1 tablet (1 tablet Oral Given 12/26/17 1035)  Tdap (BOOSTRIX) injection 0.5 mL (0.5 mLs Intramuscular Given 12/26/17 1035)  lidocaine-EPINEPHrine (XYLOCAINE W/EPI) 2 %-1:200000 (PF) injection 10 mL (10 mLs Intradermal Given 12/26/17 1038)     Initial Impression / Assessment and Plan / ED Course  I have reviewed the triage vital signs and the nursing notes.  Pertinent labs & imaging results that were available during my care of the patient were reviewed by me and considered in my medical decision making (see chart for details).     Animal control was notified and dogs vaccinations are current.    Patient with laceration secondary to dog bite to the left thigh and lower leg.  Bleeding controlled prior to closure.  Tetanus updated today.  Animal vaccinations are current.  Laceration to left lateral thigh irrigated thoroughly with saline and gaping, so it was very loosely approximated with 2 sutures.  Puncture wound of the lower leg was also thoroughly irrigated with saline.  Placed on antibiotics.  He agrees to return here in 2 days for wound recheck and sutures to be removed in 8-10 days.    Final Clinical Impressions(s) / ED Diagnoses   Final diagnoses:  Dog bite, initial encounter    ED Discharge Orders    None       Pauline Ausriplett, Dayanara Sherrill,  PA-C 12/26/17 2026    Vanetta MuldersZackowski, Scott, MD 12/27/17 [phone]

## 2017-12-26 NOTE — ED Notes (Signed)
Dressed pt's wounds with non-adhesive gauze and medipore tape.

## 2018-01-02 ENCOUNTER — Emergency Department (HOSPITAL_COMMUNITY)
Admission: EM | Admit: 2018-01-02 | Discharge: 2018-01-02 | Disposition: A | Discharge status: Home / Self Care | Payer: Worker's Compensation | Attending: Emergency Medicine | Admitting: Emergency Medicine

## 2018-01-02 ENCOUNTER — Other Ambulatory Visit: Payer: Self-pay

## 2018-01-02 ENCOUNTER — Encounter (HOSPITAL_COMMUNITY): Payer: Self-pay | Admitting: *Deleted

## 2018-01-02 DIAGNOSIS — S71112D Laceration without foreign body, left thigh, subsequent encounter: Secondary | ICD-10-CM | POA: Diagnosis present

## 2018-01-02 DIAGNOSIS — W540XXD Bitten by dog, subsequent encounter: Secondary | ICD-10-CM | POA: Insufficient documentation

## 2018-01-02 DIAGNOSIS — Y999 Unspecified external cause status: Secondary | ICD-10-CM | POA: Insufficient documentation

## 2018-01-02 DIAGNOSIS — Y929 Unspecified place or not applicable: Secondary | ICD-10-CM | POA: Diagnosis not present

## 2018-01-02 DIAGNOSIS — I10 Essential (primary) hypertension: Secondary | ICD-10-CM | POA: Insufficient documentation

## 2018-01-02 DIAGNOSIS — Z4802 Encounter for removal of sutures: Secondary | ICD-10-CM | POA: Insufficient documentation

## 2018-01-02 DIAGNOSIS — Z79899 Other long term (current) drug therapy: Secondary | ICD-10-CM | POA: Diagnosis not present

## 2018-01-02 DIAGNOSIS — Y939 Activity, unspecified: Secondary | ICD-10-CM | POA: Insufficient documentation

## 2018-01-02 NOTE — Discharge Instructions (Signed)
Allow the sterile strips to fall off on their own.  Your wound looks good without signs of infection.  The redness will improve with the removal of the stitches.

## 2018-01-02 NOTE — ED Triage Notes (Signed)
Pt here for suture removal from left thigh after dog bite. Stitches were placed last Wednesday.

## 2018-01-02 NOTE — ED Provider Notes (Signed)
[facility] EMERGENCY DEPARTMENT Provider Note   CSN: [id number] Arrival date & time: 01/02/18  1205     History   Chief Complaint Chief Complaint  Patient presents with  . Suture / Staple Removal    HPI Clayborn Milnes is a 42 y.o. male presenting for suture removal.  He had 2 sutures placed 7 days ago at the site of the dog bite on his left upper thigh.  He denies any problems with the wound site.  He has been keeping it bandaged, there has been a small amount of blood on the bandage with dressing changes but no purulent drainage or increased pain or swelling.  He is currently on Augmentin.  The dog was determined to be fully vaccinated.  The history is provided by the patient.    Past Medical History:  Diagnosis Date  . Anxiety   . Hypertension   . Insomnia   . Reflux     Patient Active Problem List   Diagnosis Date Noted  . Insomnia 01/07/2015  . Onychomycosis 05/11/2014  . Perennial allergic rhinitis 10/27/2013  . Fatty liver 10/27/2013  . Hypertension   . Anxiety     Past Surgical History:  Procedure Laterality Date  . KNEE SURGERY         Home Medications    Prior to Admission medications   Medication Sig Start Date End Date Taking? Authorizing Provider  amoxicillin-clavulanate (AUGMENTIN) 875-125 MG tablet Take 1 tablet by mouth every 12 (twelve) hours. 12/26/17   Triplett, Tammy, PA-C  citalopram (CELEXA) 40 MG tablet Take 1 tablet (40 mg total) by mouth daily. 06/29/17   Merlyn Albert, MD  enalapril (VASOTEC) 10 MG tablet TAKE (1) TABLET BY MOUTH DAILY. 12/24/17   Merlyn Albert, MD  LORazepam (ATIVAN) 1 MG tablet Take one tablet qd as needed for anxiety 06/29/17   Merlyn Albert, MD    Family History Family History  Problem Relation Age of Onset  . Hypertension Mother     Social History Social History   Tobacco Use  . Smoking status: Never Smoker  . Smokeless tobacco: Never Used  Substance Use Topics  . Alcohol use: No    Frequency:  Never  . Drug use: No     Allergies   Patient has no known allergies.   Review of Systems Review of Systems  Constitutional: Negative for chills and fever.  Respiratory: Negative for shortness of breath and wheezing.   Skin: Positive for wound.  Neurological: Negative for numbness.     Physical Exam Updated Vital Signs BP (!) 141/101   Pulse 79   Temp 98.5 F (36.9 C) (Oral)   Resp 16   Ht 6\' 2"  (1.88 m)   Wt 104.3 kg (230 lb)   SpO2 97%   BMI 29.53 kg/m   Physical Exam  Constitutional: He is oriented to person, place, and time. He appears well-developed and well-nourished.  HENT:  Head: Normocephalic.  Cardiovascular: Normal rate.  Pulmonary/Chest: Effort normal.  Musculoskeletal: He exhibits no tenderness.  Neurological: He is alert and oriented to person, place, and time. No sensory deficit.  Skin: Laceration noted.  Well-healing and scabbed over wound left upper thigh.  There is no active drainage.  There is some mild pink red border around the wound edge suggesting inflammation.  There is no signs of infection at the site.     ED Treatments / Results  Labs (all labs ordered are listed, but only abnormal results are  displayed) Labs Reviewed - No data to display  EKG  EKG Interpretation None       Radiology No results found.  Procedures Procedures (including critical care time)  Medications Ordered in ED Medications - No data to display   Initial Impression / Assessment and Plan / ED Course  I have reviewed the triage vital signs and the nursing notes.  Pertinent labs & imaging results that were available during my care of the patient were reviewed by me and considered in my medical decision making (see chart for details).     Sutures were removed by RN, Steri-Strips applied to provide support for a few extra days.  PRN follow-up anticipated.  Final Clinical Impressions(s) / ED Diagnoses   Final diagnoses:  Visit for suture removal     ED Discharge Orders    None       Victoriano Laindol, Domonique Cothran, PA-C 01/02/18 1422    Mancel BaleWentz, Elliott, MD 01/02/18 [phone]

## 2018-01-15 ENCOUNTER — Other Ambulatory Visit: Payer: Self-pay | Admitting: Family Medicine

## 2018-01-15 NOTE — Telephone Encounter (Signed)
30 d only six mo visit due 

## 2018-02-12 ENCOUNTER — Ambulatory Visit: Payer: BC Managed Care – PPO | Admitting: Family Medicine

## 2018-02-12 ENCOUNTER — Encounter: Payer: Self-pay | Admitting: Family Medicine

## 2018-02-12 VITALS — BP 124/84 | Ht 74.0 in | Wt 245.0 lb

## 2018-02-12 DIAGNOSIS — Z79899 Other long term (current) drug therapy: Secondary | ICD-10-CM

## 2018-02-12 DIAGNOSIS — I10 Essential (primary) hypertension: Secondary | ICD-10-CM

## 2018-02-12 DIAGNOSIS — F5101 Primary insomnia: Secondary | ICD-10-CM | POA: Diagnosis not present

## 2018-02-12 DIAGNOSIS — F419 Anxiety disorder, unspecified: Secondary | ICD-10-CM | POA: Diagnosis not present

## 2018-02-12 DIAGNOSIS — E785 Hyperlipidemia, unspecified: Secondary | ICD-10-CM | POA: Diagnosis not present

## 2018-02-12 MED ORDER — LORAZEPAM 1 MG PO TABS: 1.0000 mg | ORAL_TABLET | Freq: Every day | ORAL | 5 refills | Status: DC | PRN

## 2018-02-12 MED ORDER — CITALOPRAM HYDROBROMIDE 40 MG PO TABS: 40.0000 mg | ORAL_TABLET | Freq: Every day | ORAL | 1 refills | Status: DC

## 2018-02-12 MED ORDER — ENALAPRIL MALEATE 10 MG PO TABS: ORAL_TABLET | ORAL | 5 refills | Status: DC

## 2018-02-12 NOTE — Progress Notes (Signed)
   Subjective:    Patient ID: Martin Sellers, male    DOB: [birth year], 42 y.o.   MRN: [id number]  Hypertension  This is a chronic problem. The current episode started more than 1 year ago. Risk factors for coronary artery disease include male gender. Treatments tried: vasotec. There are no compliance problems.    Blood pressure medicine and blood pressure levels reviewed today with patient. Compliant with blood pressure medicine. States does not miss a dose. No obvious side effects. Blood pressure generally good when checked elsewhere. Watching salt intake.  Stress level sig day to day    , doing well with diet, but so so over the winter   Not excising muh    Review of Systems No headache, no major weight loss or weight gain, no chest pain no back pain abdominal pain no change in bowel habits complete ROS otherwise negative     Objective:   Physical Exam Alert vitals stable, NAD. Blood pressure good on repeat. HEENT normal. Lungs clear. Heart regular rate and rhythm.        Assessment & Plan:  Impression 1 hypertension good control discussed maintain same #2 chronic anxiety stable on current meds to maintain plan diet exercise discussed.  Medicines refilled.  Appropriate blood work recheck in 6 months

## 2018-02-13 LAB — LIPID PANEL
CHOLESTEROL TOTAL: 228 mg/dL — AB (ref 100–199)
Chol/HDL Ratio: 2.8 ratio (ref 0.0–5.0)
HDL: 82 mg/dL (ref >39)
LDL Calculated: 129 mg/dL — ABNORMAL HIGH (ref 0–99)
Triglycerides: 86 mg/dL (ref 0–149)
VLDL Cholesterol Cal: 17 mg/dL (ref 5–40)

## 2018-02-13 LAB — BASIC METABOLIC PANEL
BUN / CREAT RATIO: 11 (ref 9–20)
BUN: 10 mg/dL (ref 6–24)
CHLORIDE: 94 mmol/L — AB (ref 96–106)
CO2: 23 mmol/L (ref 20–29)
Calcium: 10.5 mg/dL — ABNORMAL HIGH (ref 8.7–10.2)
Creatinine, Ser: 0.89 mg/dL (ref 0.76–1.27)
GFR calc non Af Amer: 105 mL/min/{1.73_m2} (ref >59)
GFR, EST AFRICAN AMERICAN: 122 mL/min/{1.73_m2} (ref >59)
Glucose: 88 mg/dL (ref 65–99)
Potassium: 5 mmol/L (ref 3.5–5.2)
SODIUM: 135 mmol/L (ref 134–144)

## 2018-02-13 LAB — HEPATIC FUNCTION PANEL
ALK PHOS: 60 IU/L (ref 39–117)
ALT: 40 IU/L (ref 0–44)
AST: 34 IU/L (ref 0–40)
Albumin: 5.2 g/dL (ref 3.5–5.5)
BILIRUBIN TOTAL: 1.9 mg/dL — AB (ref 0.0–1.2)
BILIRUBIN, DIRECT: 0.37 mg/dL (ref 0.00–0.40)
Total Protein: 7.9 g/dL (ref 6.0–8.5)

## 2018-02-17 ENCOUNTER — Encounter: Payer: Self-pay | Admitting: Family Medicine

## 2018-02-19 NOTE — Addendum Note (Signed)
Addended by: Margaretha SheffieldBROWN, Aspynn Clover S on: 02/19/2018 09:56 AM   Modules accepted: Orders

## 2018-05-20 ENCOUNTER — Other Ambulatory Visit: Payer: Self-pay | Admitting: Family Medicine

## 2018-07-18 ENCOUNTER — Other Ambulatory Visit: Payer: Self-pay | Admitting: Family Medicine

## 2018-07-18 NOTE — Telephone Encounter (Signed)
May have this refill on each needs follow-up office visit

## 2018-08-20 ENCOUNTER — Telehealth: Payer: Self-pay | Admitting: Family Medicine

## 2018-08-20 ENCOUNTER — Other Ambulatory Visit: Payer: Self-pay | Admitting: *Deleted

## 2018-08-20 MED ORDER — CITALOPRAM HYDROBROMIDE 40 MG PO TABS: 40.0000 mg | ORAL_TABLET | Freq: Every day | ORAL | 0 refills | Status: DC

## 2018-08-20 MED ORDER — ENALAPRIL MALEATE 10 MG PO TABS: ORAL_TABLET | ORAL | 0 refills | Status: DC

## 2018-08-20 MED ORDER — LORAZEPAM 1 MG PO TABS: 1.0000 mg | ORAL_TABLET | Freq: Every day | ORAL | 0 refills | Status: DC | PRN

## 2018-08-20 NOTE — Telephone Encounter (Signed)
Last seen 01/2018

## 2018-08-20 NOTE — Telephone Encounter (Signed)
Refill sent to pharm. Tried to call pt to notify no answer

## 2018-08-20 NOTE — Telephone Encounter (Signed)
30 d worth of ea

## 2018-08-20 NOTE — Telephone Encounter (Signed)
Pt requesting refill on medication's listed below, Pt was notified about needing to schedule an appointment in regards of pursuing his refill, presented Pt with different times and days to come in for treatment Pt stated "this week is just not a good week for me and he needed his medications now" Informed Pt that I would place a note for a nurse to give him a call back in regards of having his medications refilled. citalopram (CELEXA) 40 MG tablet  enalapril (VASOTEC) 10 MG tablet  LORazepam (ATIVAN) 1 MG tablet

## 2018-08-20 NOTE — Telephone Encounter (Signed)
Await signature 

## 2018-08-21 NOTE — Telephone Encounter (Signed)
Pt.notified

## 2018-09-17 ENCOUNTER — Ambulatory Visit: Payer: BC Managed Care – PPO | Admitting: Family Medicine

## 2018-09-17 ENCOUNTER — Encounter: Payer: Self-pay | Admitting: Family Medicine

## 2018-09-17 DIAGNOSIS — E785 Hyperlipidemia, unspecified: Secondary | ICD-10-CM

## 2018-09-17 DIAGNOSIS — F5101 Primary insomnia: Secondary | ICD-10-CM

## 2018-09-17 DIAGNOSIS — M7712 Lateral epicondylitis, left elbow: Secondary | ICD-10-CM

## 2018-09-17 DIAGNOSIS — I10 Essential (primary) hypertension: Secondary | ICD-10-CM

## 2018-09-17 MED ORDER — LORAZEPAM 1 MG PO TABS: 1.0000 mg | ORAL_TABLET | Freq: Every day | ORAL | 5 refills | Status: DC | PRN

## 2018-09-17 MED ORDER — CITALOPRAM HYDROBROMIDE 40 MG PO TABS: 40.0000 mg | ORAL_TABLET | Freq: Every day | ORAL | 5 refills | Status: DC

## 2018-09-17 MED ORDER — ENALAPRIL MALEATE 10 MG PO TABS: ORAL_TABLET | ORAL | 5 refills | Status: DC

## 2018-09-17 NOTE — Progress Notes (Signed)
   Subjective:    Patient ID: Martin Sellers, male    DOB: Apr 30, 1976, 42 y.o.   MRN: [id number] Patient arrives with multiple concerns HPI  Patient is here today to follow up on chronic health issues.  He states he occasionally eats healthy and exercises.He does not see any specialist.  Blood pressure medicine and blood pressure levels reviewed today with patient. Compliant with blood pressure medicine. States does not miss a dose. No obvious side effects. Blood pressure generally good when checked elsewhere. Watching salt intake.   anxiety, overall stable, compliant with medications.  Definitely helps function.   Patient compliant with insomnia medication. Generally takes most nights. No obvious morning drowsiness. Definitely helps patient sleep. Without it patient states would not get a good nights rest.   Notes progressive elbow pain.  Left lateral elbow.  Deep ache.  Off and on for many weeks now.  Wonders what to do about it   xecise not as good  Review of Systems No headache, no major weight loss or weight gain, no chest pain no back pain abdominal pain no change in bowel habits complete ROS otherwise negative     Objective:   Physical Exam  Alert and oriented, vitals reviewed and stable, NAD ENT-TM's and ext canals WNL bilat via otoscopic exam Soft palate, tonsils and post pharynx WNL via oropharyngeal exam Neck-symmetric, no masses; thyroid nonpalpable and nontender Pulmonary-no tachypnea or accessory muscle use; Clear without wheezes via auscultation Card--no abnrml murmurs, rhythm reg and rate WNL Carotid pulses symmetric, without bruits Lateral epicondyle tenderness.  Left arm.  Strength intact no deformity      Assessment & Plan:  Impression 1 hypertension.  Good control discussed maintain same meds  2.  Chronic anxiety.  Ongoing need for meds meds refilled  3.  Insomnia.  Ongoing challenges medications refilled  4.  Left lateral epicondylitis.  Local  measures discussed including forearm strap and PRN anti-inflammatory  Follow-up in 6 months./Met 7 to follow-up calcium level see prior notes

## 2018-09-18 LAB — BASIC METABOLIC PANEL
BUN / CREAT RATIO: 15 (ref 9–20)
BUN: 12 mg/dL (ref 6–24)
CHLORIDE: 100 mmol/L (ref 96–106)
CO2: 26 mmol/L (ref 20–29)
Calcium: 10 mg/dL (ref 8.7–10.2)
Creatinine, Ser: 0.79 mg/dL (ref 0.76–1.27)
GFR, EST AFRICAN AMERICAN: 128 mL/min/{1.73_m2} (ref >59)
GFR, EST NON AFRICAN AMERICAN: 111 mL/min/{1.73_m2} (ref >59)
Glucose: 99 mg/dL (ref 65–99)
POTASSIUM: 4.4 mmol/L (ref 3.5–5.2)
SODIUM: 141 mmol/L (ref 134–144)

## 2018-09-22 ENCOUNTER — Encounter: Payer: Self-pay | Admitting: Family Medicine

## 2019-03-17 ENCOUNTER — Other Ambulatory Visit: Payer: Self-pay | Admitting: Family Medicine

## 2019-03-17 NOTE — Telephone Encounter (Signed)
One mo only sched virtual visit befroe finished

## 2019-04-01 ENCOUNTER — Encounter: Payer: Self-pay | Admitting: Family Medicine

## 2019-04-01 ENCOUNTER — Ambulatory Visit (INDEPENDENT_AMBULATORY_CARE_PROVIDER_SITE_OTHER): Payer: BC Managed Care – PPO | Admitting: Family Medicine

## 2019-04-01 ENCOUNTER — Other Ambulatory Visit: Payer: Self-pay

## 2019-04-01 DIAGNOSIS — I10 Essential (primary) hypertension: Secondary | ICD-10-CM

## 2019-04-01 DIAGNOSIS — F419 Anxiety disorder, unspecified: Secondary | ICD-10-CM | POA: Diagnosis not present

## 2019-04-01 MED ORDER — ENALAPRIL MALEATE 10 MG PO TABS: ORAL_TABLET | ORAL | 5 refills | Status: DC

## 2019-04-01 MED ORDER — CITALOPRAM HYDROBROMIDE 40 MG PO TABS: 40.0000 mg | ORAL_TABLET | Freq: Every day | ORAL | 5 refills | Status: DC

## 2019-04-01 MED ORDER — LORAZEPAM 1 MG PO TABS: 1.0000 mg | ORAL_TABLET | Freq: Every day | ORAL | 5 refills | Status: DC | PRN

## 2019-04-01 NOTE — Progress Notes (Signed)
   Subjective:    Patient ID: Martin Sellers, male    DOB: 04-Apr-1976, 43 y.o.   MRN: [id number] Audio plus visual Hypertension  This is a chronic problem. The current episode started more than 1 year ago. Risk factors for coronary artery disease include dyslipidemia and male gender. Treatments tried: vasotec. There are no compliance problems.     Virtual Visit via Video Note  I connected with Martin Sellers on 04/01/19 at  9:30 AM EDT by a video enabled telemedicine application and verified that I am speaking with the correct person using two identifiers.   I discussed the limitations of evaluation and management by telemedicine and the availability of in person appointments. The patient expressed understanding and agreed to proceed.  History of Present Illness:    Observations/Objective:   Assessment and Plan:   Follow Up Instructions:    I discussed the assessment and treatment plan with the patient. The patient was provided an opportunity to ask questions and all were answered. The patient agreed with the plan and demonstrated an understanding of the instructions.   The patient was advised to call back or seek an in-person evaluation if the symptoms worsen or if the condition fails to improve as anticipated.  I provided 20 minutes of non-face-to-face time during this encounter.   Blood pressure medicine and blood pressure levels reviewed today with patient. Compliant with blood pressure medicine. States does not miss a dose. No obvious side effects. Blood pressure generally good when checked elsewhere. Watching salt intake.  Patient compliant with anxiety medication.  States overall definitely helping   Review of Systems No headache, no major weight loss or weight gain, no chest pain no back pain abdominal pain no change in bowel habits complete ROS otherwise negative     Objective:   Physical Exam   Alert vitals stable, NAD. Blood pressure good on repeat. HEENT normal.  Lungs clear. Heart regular rate and rhythm.      Assessment & Plan:  Impression 1 hypertension.  Good control discussed to maintain same meds  2.  Generalized anxiety disorder clinically stable patient to maintain same

## 2019-04-15 ENCOUNTER — Other Ambulatory Visit: Payer: Self-pay | Admitting: Family Medicine

## 2019-04-15 NOTE — Telephone Encounter (Signed)
Tried to contact patient; no voicemail set up  

## 2019-04-16 NOTE — Telephone Encounter (Signed)
Pt is calling stating [facility] does not have his LORazepam (ATIVAN) 1 MG tablet

## 2019-04-16 NOTE — Telephone Encounter (Signed)
Med called into pharm since they did not receive rx

## 2019-09-10 ENCOUNTER — Other Ambulatory Visit: Payer: Self-pay | Admitting: Family Medicine

## 2019-09-10 NOTE — Telephone Encounter (Signed)
One mo o v next mo virt or f to f

## 2019-09-17 NOTE — Telephone Encounter (Signed)
Patient has appointment for follow up on 10/7

## 2019-09-24 ENCOUNTER — Other Ambulatory Visit: Payer: Self-pay

## 2019-09-24 ENCOUNTER — Ambulatory Visit (INDEPENDENT_AMBULATORY_CARE_PROVIDER_SITE_OTHER): Payer: BC Managed Care – PPO | Admitting: Family Medicine

## 2019-09-24 DIAGNOSIS — F5101 Primary insomnia: Secondary | ICD-10-CM | POA: Diagnosis not present

## 2019-09-24 DIAGNOSIS — I10 Essential (primary) hypertension: Secondary | ICD-10-CM | POA: Diagnosis not present

## 2019-09-24 DIAGNOSIS — F419 Anxiety disorder, unspecified: Secondary | ICD-10-CM

## 2019-09-24 MED ORDER — LORAZEPAM 1 MG PO TABS: 1.0000 mg | ORAL_TABLET | Freq: Every day | ORAL | 5 refills | Status: DC | PRN

## 2019-09-24 MED ORDER — ENALAPRIL MALEATE 10 MG PO TABS: ORAL_TABLET | ORAL | 5 refills | Status: DC

## 2019-09-24 MED ORDER — CITALOPRAM HYDROBROMIDE 40 MG PO TABS: 40.0000 mg | ORAL_TABLET | Freq: Every day | ORAL | 5 refills | Status: DC

## 2019-09-24 NOTE — Progress Notes (Signed)
   Subjective:    Patient ID: Martin Sellers, male    DOB: 08-Feb-1976, 43 y.o.   MRN: [id number]  Hypertension This is a chronic problem. There are no compliance problems.   Hyperlipidemia This is a chronic problem. There are no compliance problems.   pt is on Enalapril 10 mg daily for blood pressure. Pt states he has not checked his blood pressure lately but feels fine. No issues or concerns.   Virtual Visit via Video Note  I connected with Martin Sellers on 09/24/19 at 10:00 AM EDT by a video enabled telemedicine application and verified that I am speaking with the correct person using two identifiers.  Location: Patient: home Provider: office   I discussed the limitations of evaluation and management by telemedicine and the availability of in person appointments. The patient expressed understanding and agreed to proceed.  History of Present Illness:    Observations/Objective:   Assessment and Plan:   Follow Up Instructions:    I discussed the assessment and treatment plan with the patient. The patient was provided an opportunity to ask questions and all were answered. The patient agreed with the plan and demonstrated an understanding of the instructions.   The patient was advised to call back or seek an in-person evaluation if the symptoms worsen or if the condition fails to improve as anticipated.  I provided 18 minutes of non-face-to-face time during this encounter.  Blood pressure medicine and blood pressure levels reviewed today with patient. Compliant with blood pressure medicine. States does not miss a dose. No obvious side effects. Blood pressure generally good when checked elsewhere. Watching salt intake.   Patient continues to take lipid medication regularly. No obvious side effects from it. Generally does not miss a dose. Prior blood work results are reviewed with patient. Patient continues to work on fat intake in diet   Vicente Males, LPN     Review of Systems  No headache, no major weight loss or weight gain, no chest pain no back pain abdominal pain no change in bowel habits complete ROS otherwise negative     Objective:   Physical Exam  Virtual      Assessment & Plan:  Impression 1 hypertension.  Apparent good control compliance discussed medication refill diet discussed  2.  Anxiety.  Ongoing element with insomnia.  Discussed medications refilled to maintain  Exercise encouraged flu shot discussed and encouraged medications refilled follow-up in 6 months

## 2019-09-28 ENCOUNTER — Encounter: Payer: Self-pay | Admitting: Family Medicine

## 2019-11-10 ENCOUNTER — Telehealth: Payer: Self-pay | Admitting: Family Medicine

## 2019-11-10 ENCOUNTER — Other Ambulatory Visit: Payer: Self-pay | Admitting: Family Medicine

## 2019-11-10 NOTE — Telephone Encounter (Signed)
5 refill sent in with 09/24/2019 visit. Pt contacted and made aware that refills were sent in. Pt encouraged to contact pharmacy. Pt verbalized understanding.

## 2019-11-10 NOTE — Telephone Encounter (Signed)
Patient needing refills on citalopram (CELEXA) 40 MG tablet  enalapril (VASOTEC) 10 MG  and LORazepam (ATIVAN) 1 MG tablet   Sent to [facility]

## 2019-11-10 NOTE — Telephone Encounter (Signed)
Six mo worth 

## 2020-01-19 ENCOUNTER — Encounter: Payer: Self-pay | Admitting: Family Medicine

## 2020-04-03 ENCOUNTER — Ambulatory Visit
Admission: EM | Admit: 2020-04-03 | Discharge: 2020-04-03 | Disposition: A | Discharge status: Home / Self Care | Payer: BC Managed Care – PPO | Attending: Emergency Medicine | Admitting: Emergency Medicine

## 2020-04-03 ENCOUNTER — Encounter: Payer: Self-pay | Admitting: Emergency Medicine

## 2020-04-03 ENCOUNTER — Other Ambulatory Visit: Payer: Self-pay

## 2020-04-03 DIAGNOSIS — M25461 Effusion, right knee: Secondary | ICD-10-CM

## 2020-04-03 DIAGNOSIS — M25561 Pain in right knee: Secondary | ICD-10-CM | POA: Diagnosis not present

## 2020-04-03 MED ORDER — PREDNISONE 10 MG (21) PO TBPK: ORAL_TABLET | Freq: Every day | ORAL | 0 refills | Status: DC

## 2020-04-03 NOTE — ED Provider Notes (Signed)
[facility]   [id number] 04/03/20 Arrival Time: 1100  CC: RT knee PAIN  SUBJECTIVE: History from: patient. Martin Sellers is a 44 y.o. male complains of right knee pain/ swelling that began 3 days ago.  Denies a precipitating event or specific injury.  Diffuse about the knee.  Describes the pain as constant and "uncomfortable" in character.  Has tried OTC ibuprofen without relief.  Symptoms are made worse with full extension.  Denies similar symptoms in the past.  Denies fever, chills, erythema, ecchymosis, weakness, numbness and tingling.    ROS: As per HPI.  All other pertinent ROS negative.     Past Medical History:  Diagnosis Date  . Anxiety   . Hypertension   . Insomnia   . Reflux    Past Surgical History:  Procedure Laterality Date  . KNEE SURGERY     No Known Allergies No current facility-administered medications on file prior to encounter.   Current Outpatient Medications on File Prior to Encounter  Medication Sig Dispense Refill  . citalopram (CELEXA) 40 MG tablet Take 1 tablet (40 mg total) by mouth daily. 30 tablet 5  . enalapril (VASOTEC) 10 MG tablet TAKE (1) TABLET BY MOUTH DAILY. 30 tablet 5   Social History   Socioeconomic History  . Marital status: Divorced    Spouse name: Not on file  . Number of children: Not on file  . Years of education: Not on file  . Highest education level: Not on file  Occupational History  . Not on file  Tobacco Use  . Smoking status: Never Smoker  . Smokeless tobacco: Never Used  Substance and Sexual Activity  . Alcohol use: No  . Drug use: No  . Sexual activity: Not on file  Other Topics Concern  . Not on file  Social History Narrative  . Not on file   Social Determinants of Health   Financial Resource Strain:   . Difficulty of Paying Living Expenses:   Food Insecurity:   . Worried About Charity fundraiser in the Last Year:   . Arboriculturist in the Last Year:   Transportation Needs:   . Lexicographer (Medical):   Marland Kitchen Lack of Transportation (Non-Medical):   Physical Activity:   . Days of Exercise per Week:   . Minutes of Exercise per Session:   Stress:   . Feeling of Stress :   Social Connections:   . Frequency of Communication with Friends and Family:   . Frequency of Social Gatherings with Friends and Family:   . Attends Religious Services:   . Active Member of Clubs or Organizations:   . Attends Archivist Meetings:   Marland Kitchen Marital Status:   Intimate Partner Violence:   . Fear of Current or Ex-Partner:   . Emotionally Abused:   Marland Kitchen Physically Abused:   . Sexually Abused:    Family History  Problem Relation Age of Onset  . Hypertension Mother     OBJECTIVE:  Vitals:   04/03/20 1109  BP: (!) 155/101  Pulse: 88  Resp: 18  Temp: 98.9 F (37.2 C)  TempSrc: Oral  SpO2: 96%    General appearance: ALERT; in no acute distress.  Head: NCAT Lungs: Normal respiratory effort CV: Dorsalis pedis pulse 2+ Musculoskeletal: RT knee Inspection: mild diffuse swelling Palpation: diffusely TTP over knee ROM: LROM about the knee Strength: 5/5 knee abduction, 5/5 knee adduction, 5/5 knee flexion, 5/5 knee extension Stability: Anterior/ posterior drawer  intact Skin: warm and dry Neurologic: Ambulates without difficulty; Sensation intact about the lower extremities Psychological: alert and cooperative; normal mood and affect  ASSESSMENT & PLAN:  1. Pain and swelling of right knee     Meds ordered this encounter  Medications  . predniSONE (STERAPRED UNI-PAK 21 TAB) 10 MG (21) TBPK tablet    Sig: Take by mouth daily. Take 6 tabs by mouth daily  for 2 days, then 5 tabs for 2 days, then 4 tabs for 2 days, then 3 tabs for 2 days, 2 tabs for 2 days, then 1 tab by mouth daily for 2 days    Dispense:  42 tablet    Refill:  0    Order Specific Question:   Supervising Provider    Answer:   Eustace Moore [[id number]]    Continue conservative management of rest,  ice, elevation, and gentle stretches Wear knee brace with stability Prednisone prescribed.  Take as directed and to completion Follow up with PCP if symptoms persist Return or go to the ER if you have any new or worsening symptoms (fever, chills, redness, decreased strength, bruising, severe pain, worsening symptoms despite medication, etc...)   Reviewed expectations re: course of current medical issues. Questions answered. Outlined signs and symptoms indicating need for more acute intervention. Patient verbalized understanding. After Visit Summary given.    Rennis Harding, PA-C 04/03/20 1126

## 2020-04-03 NOTE — ED Triage Notes (Signed)
Right knee pain and swelling for about 3 days. No injury.

## 2020-04-03 NOTE — Discharge Instructions (Signed)
Continue conservative management of rest, ice, elevation, and gentle stretches Wear knee brace with stability Prednisone prescribed.  Take as directed and to completion Follow up with PCP if symptoms persist Return or go to the ER if you have any new or worsening symptoms (fever, chills, redness, decreased strength, bruising, severe pain, worsening symptoms despite medication, etc...)

## 2020-04-08 ENCOUNTER — Other Ambulatory Visit: Payer: Self-pay | Admitting: Family Medicine

## 2020-04-08 NOTE — Telephone Encounter (Signed)
Ok times three mo, call pt, rec ov with me or dr taylorbefore runs out

## 2020-04-28 ENCOUNTER — Telehealth: Payer: Self-pay | Admitting: *Deleted

## 2020-04-28 ENCOUNTER — Other Ambulatory Visit: Payer: Self-pay

## 2020-04-28 ENCOUNTER — Telehealth (INDEPENDENT_AMBULATORY_CARE_PROVIDER_SITE_OTHER): Payer: BC Managed Care – PPO | Admitting: Family Medicine

## 2020-04-28 DIAGNOSIS — F419 Anxiety disorder, unspecified: Secondary | ICD-10-CM | POA: Diagnosis not present

## 2020-04-28 DIAGNOSIS — I10 Essential (primary) hypertension: Secondary | ICD-10-CM | POA: Diagnosis not present

## 2020-04-28 MED ORDER — CITALOPRAM HYDROBROMIDE 40 MG PO TABS: 40.0000 mg | ORAL_TABLET | Freq: Every day | ORAL | 5 refills | Status: DC

## 2020-04-28 MED ORDER — ENALAPRIL MALEATE 10 MG PO TABS: ORAL_TABLET | ORAL | 5 refills | Status: DC

## 2020-04-28 MED ORDER — ETODOLAC 400 MG PO TABS: ORAL_TABLET | ORAL | 0 refills | Status: DC

## 2020-04-28 NOTE — Telephone Encounter (Signed)
[name], mccahill are scheduled for a virtual visit with your provider today.    Just as we do with appointments in the office, we must obtain your consent to participate.  Your consent will be active for this visit and any virtual visit you may have with one of our providers in the next 365 days.    If you have a MyChart account, I can also send a copy of this consent to you electronically.  All virtual visits are billed to your insurance company just like a traditional visit in the office.  As this is a virtual visit, video technology does not allow for your provider to perform a traditional examination.  This may limit your provider's ability to fully assess your condition.  If your provider identifies any concerns that need to be evaluated in person or the need to arrange testing such as labs, EKG, etc, we will make arrangements to do so.    Although advances in technology are sophisticated, we cannot ensure that it will always work on either your end or our end.  If the connection with a video visit is poor, we may have to switch to a telephone visit.  With either a video or telephone visit, we are not always able to ensure that we have a secure connection.   I need to obtain your verbal consent now.   Are you willing to proceed with your visit today?   Martin Sellers has provided verbal consent on 04/28/2020 for a virtual visit (video or telephone).   Martin Rushing, LPN 5/64/3329  51:88 AM

## 2020-04-28 NOTE — Progress Notes (Signed)
   Subjective:    Patient ID: Martin Sellers, male    DOB: 1976-08-29, 44 y.o.   MRN: [id number]  Hypertension This is a chronic problem. The current episode started more than 1 year ago. Treatments tried: vasotec.  Patient reports diet is fair and some exercise before right knee started swelling.   No other concerns.  Blood pressure medicine and blood pressure levels reviewed today with patient. Compliant with blood pressure medicine. States does not miss a dose. No obvious side effects. Blood pressure generally good when checked elsewhere. Watching salt intake.  Patient states anxiety overall in good control on current meds Review of Systems No headache no chest pain no shortness of breath    Objective:   Physical Exam  Virtual Visit via Video Note  I connected with Martin Sellers on 04/28/20 at  2:30 PM EDT by a video enabled telemedicine application and verified that I am speaking with the correct person using two identifiers.  Location: Patient: home  Provider: office   I discussed the limitations of evaluation and management by telemedicine and the availability of in person appointments. The patient expressed understanding and agreed to proceed.  History of Present Illness:    Observations/Objective:   Assessment and Plan:   Follow Up Instructions:    I discussed the assessment and treatment plan with the patient. The patient was provided an opportunity to ask questions and all were answered. The patient agreed with the plan and demonstrated an understanding of the instructions.   The patient was advised to call back or seek an in-person evaluation if the symptoms worsen or if the condition fails to improve as anticipated.  I provided 22 minutes of non-face-to-face time during this encounter.        Assessment & Plan:  Impression 1 hypertension.  Apparent good control discussed maintain same meds  2.  Generalized anxiety disorder.  Ongoing Celexa refilled  Diet  exercise discussed.  Blood work discussed and encouraged.  Medications refilled.  Follow-up in 6 months

## 2020-05-06 ENCOUNTER — Encounter: Payer: Self-pay | Admitting: Orthopaedic Surgery

## 2020-05-06 ENCOUNTER — Ambulatory Visit: Payer: BC Managed Care – PPO | Admitting: Orthopaedic Surgery

## 2020-05-06 ENCOUNTER — Other Ambulatory Visit: Payer: Self-pay

## 2020-05-06 ENCOUNTER — Ambulatory Visit: Payer: BC Managed Care – PPO

## 2020-05-06 VITALS — BP 142/110 | HR 78 | Ht 74.0 in | Wt 235.0 lb

## 2020-05-06 DIAGNOSIS — G8929 Other chronic pain: Secondary | ICD-10-CM

## 2020-05-06 DIAGNOSIS — M25561 Pain in right knee: Secondary | ICD-10-CM | POA: Diagnosis not present

## 2020-05-06 NOTE — Progress Notes (Signed)
Subjective:    Patient ID: Martin Sellers, male    DOB: 05/03/1976, 44 y.o.   MRN: [id number]  HPI He has pain of the right knee that is getting worse.  He has medial joint line pain.  He has swelling and popping but no giving way.  He has planned a trip to go fishing on the [place] in [place].     He has had prior arthroscopy of his right knee about 24 or 25 years ago. He has some swelling at times of the knee for years.  He has no new trauma.  He has no redness or numbness.  He wants to have an injection today.  Dr. Lubertha South has given him Lodine which he takes daily.   Review of Systems  Constitutional: Positive for activity change.  Musculoskeletal: Positive for arthralgias, gait problem and joint swelling.  All other systems reviewed and are negative.  For Review of Systems, all other systems reviewed and are negative.  The following is a summary of the past history medically, past history surgically, known current medicines, social history and family history.  This information is gathered electronically by the computer from prior information and documentation.  I review this each visit and have found including this information at this point in the chart is beneficial and informative.   Past Medical History:  Diagnosis Date  . Anxiety   . Hypertension   . Insomnia   . Reflux     Past Surgical History:  Procedure Laterality Date  . KNEE SURGERY      Current Outpatient Medications on File Prior to Visit  Medication Sig Dispense Refill  . citalopram (CELEXA) 40 MG tablet Take 1 tablet (40 mg total) by mouth daily. 30 tablet 5  . enalapril (VASOTEC) 10 MG tablet TAKE (1) TABLET BY MOUTH DAILY. 30 tablet 5  . etodolac (LODINE) 400 MG tablet Take one tablet po BID with food prn flare 28 tablet 0   No current facility-administered medications on file prior to visit.    Social History   Socioeconomic History  . Marital status: Divorced    Spouse name: Not on  file  . Number of children: Not on file  . Years of education: Not on file  . Highest education level: Not on file  Occupational History  . Not on file  Tobacco Use  . Smoking status: Never Smoker  . Smokeless tobacco: Never Used  Substance and Sexual Activity  . Alcohol use: No  . Drug use: No  . Sexual activity: Not on file  Other Topics Concern  . Not on file  Social History Narrative  . Not on file   Social Determinants of Health   Financial Resource Strain:   . Difficulty of Paying Living Expenses:   Food Insecurity:   . Worried About Programme researcher, broadcasting/film/video in the Last Year:   . Barista in the Last Year:   Transportation Needs:   . Freight forwarder (Medical):   Marland Kitchen Lack of Transportation (Non-Medical):   Physical Activity:   . Days of Exercise per Week:   . Minutes of Exercise per Session:   Stress:   . Feeling of Stress :   Social Connections:   . Frequency of Communication with Friends and Family:   . Frequency of Social Gatherings with Friends and Family:   . Attends Religious Services:   . Active Member of Clubs or Organizations:   . Attends Club or  Organization Meetings:   Marland Kitchen Marital Status:   Intimate Partner Violence:   . Fear of Current or Ex-Partner:   . Emotionally Abused:   Marland Kitchen Physically Abused:   . Sexually Abused:     Family History  Problem Relation Age of Onset  . Hypertension Mother     BP (!) 142/110   Pulse 78   Ht 6\' 2"  (1.88 m)   Wt 235 lb (106.6 kg)   BMI 30.17 kg/m   Body mass index is 30.17 kg/m.      Objective:   Physical Exam Vitals and nursing note reviewed.  Constitutional:      Appearance: He is well-developed.  HENT:     Head: Normocephalic and atraumatic.  Eyes:     Conjunctiva/sclera: Conjunctivae normal.     Pupils: Pupils are equal, round, and reactive to light.  Cardiovascular:     Rate and Rhythm: Normal rate and regular rhythm.  Pulmonary:     Effort: Pulmonary effort is normal.  Abdominal:       Palpations: Abdomen is soft.  Musculoskeletal:     Cervical back: Normal range of motion and neck supple.       Legs:  Skin:    General: Skin is warm and dry.  Neurological:     Mental Status: He is alert and oriented to person, place, and time.     Cranial Nerves: No cranial nerve deficit.     Motor: No abnormal muscle tone.     Coordination: Coordination normal.     Deep Tendon Reflexes: Reflexes are normal and symmetric. Reflexes normal.  Psychiatric:        Behavior: Behavior normal.        Thought Content: Thought content normal.        Judgment: Judgment normal.    X-rays were done of the right knee, three views, reported separately.       Assessment & Plan:   Encounter Diagnosis  Name Primary?  . Chronic pain of right knee Yes   PROCEDURE NOTE:  The patient requests injections of the right knee , verbal consent was obtained.  The right knee was prepped appropriately after time out was performed.   Sterile technique was observed and injection of 1 cc of Depo-Medrol 40 mg with several cc's of plain xylocaine. Anesthesia was provided by ethyl chloride and a 20-gauge needle was used to inject the knee area. The injection was tolerated well.  A band aid dressing was applied.  The patient was advised to apply ice later today and tomorrow to the injection sight as needed.  Continue the Lodine.  He may need MRI of the knee.  Return in one month.  Call if any problem.  Precautions discussed.   Electronically Signed Sanjuana Kava, MD 5/20/202111:46 AM

## 2020-06-03 ENCOUNTER — Other Ambulatory Visit: Payer: Self-pay

## 2020-06-03 ENCOUNTER — Encounter: Payer: Self-pay | Admitting: Orthopaedic Surgery

## 2020-06-03 ENCOUNTER — Ambulatory Visit: Payer: BC Managed Care – PPO | Admitting: Orthopaedic Surgery

## 2020-06-03 VITALS — BP 136/96 | HR 82 | Ht 74.0 in | Wt 238.1 lb

## 2020-06-03 DIAGNOSIS — M25561 Pain in right knee: Secondary | ICD-10-CM | POA: Diagnosis not present

## 2020-06-03 DIAGNOSIS — G8929 Other chronic pain: Secondary | ICD-10-CM | POA: Diagnosis not present

## 2020-06-03 NOTE — Progress Notes (Signed)
I am much better  His right knee is not hurting like it was before.  The injection really helped.  He is on Lodine.  I told him I am concerned about a meniscus tear medially.  I will see him as needed.  Encounter Diagnosis  Name Primary?   Chronic pain of right knee Yes   Call if any problem.  Precautions discussed.   Electronically Signed Darreld Mclean, MD 6/17/20219:35 AM

## 2020-06-07 ENCOUNTER — Other Ambulatory Visit: Payer: Self-pay | Admitting: Family Medicine

## 2020-08-09 ENCOUNTER — Telehealth: Payer: Self-pay | Admitting: Family Medicine

## 2020-08-09 NOTE — Telephone Encounter (Signed)
Pt needs to be seen.  We do not prescribe these medications long term and he can come in to discuss other alternatives.   Thx.   Dr. Ladona Ridgel

## 2020-08-09 NOTE — Telephone Encounter (Signed)
Pt is requesting refill on LORazepam 1 MG    He states he had stopped the medication but is having some anxiety and started taking it again. Pt has appt to [facility] 9/14 (first available)   [facility].

## 2020-08-11 ENCOUNTER — Telehealth: Payer: Self-pay | Admitting: Family Medicine

## 2020-08-11 MED ORDER — LORAZEPAM 1 MG PO TABS: ORAL_TABLET | ORAL | 0 refills | Status: DC

## 2020-08-11 NOTE — Telephone Encounter (Signed)
Small amt given till appt. Thx. Dr. Ladona Ridgel

## 2020-08-11 NOTE — Telephone Encounter (Signed)
Pt calling to check on the refill he requested for LORAZEPAM 1mg   Please advise & call pt     

## 2020-08-11 NOTE — Telephone Encounter (Signed)
Patient notified and states she has had a lot of stress lately. He is only taking a half a tablet when needed. He could not get a sooner appointment than 9/14 and is requesting a few tablets to help him until then. Notified Dr. Ladona Ridgel does not prescribe long term or without being seen and we could call him back if anything changes.

## 2020-08-31 ENCOUNTER — Other Ambulatory Visit: Payer: Self-pay

## 2020-08-31 ENCOUNTER — Encounter: Payer: Self-pay | Admitting: Family Medicine

## 2020-08-31 ENCOUNTER — Ambulatory Visit: Payer: BC Managed Care – PPO | Admitting: Family Medicine

## 2020-08-31 VITALS — BP 124/90 | HR 94 | Temp 97.1°F | Ht 74.0 in | Wt 239.2 lb

## 2020-08-31 DIAGNOSIS — I10 Essential (primary) hypertension: Secondary | ICD-10-CM

## 2020-08-31 MED ORDER — LORAZEPAM 1 MG PO TABS
ORAL_TABLET | ORAL | 2 refills | Status: DC
Start: 2020-08-31 — End: 2021-01-21

## 2020-08-31 MED ORDER — AMLODIPINE BESYLATE 2.5 MG PO TABS: 2.5000 mg | ORAL_TABLET | Freq: Every day | ORAL | 1 refills | Status: DC

## 2020-08-31 MED ORDER — CITALOPRAM HYDROBROMIDE 40 MG PO TABS
40.0000 mg | ORAL_TABLET | Freq: Every day | ORAL | 1 refills | Status: DC
Start: 2020-08-31 — End: 2021-02-28

## 2020-08-31 MED ORDER — ENALAPRIL MALEATE 10 MG PO TABS
ORAL_TABLET | ORAL | 5 refills | Status: DC
Start: 2020-08-31 — End: 2021-03-02

## 2020-08-31 NOTE — Progress Notes (Signed)
Patient ID: Martin Sellers, male    DOB: 07-26-1976, 44 y.o.   MRN: [id number]   Chief Complaint  Patient presents with  . Hypertension   Subjective:    HPI Pt seen for f/u on HTN.  Having stress and dealing with anxiety.  Has daughter at 69yr with anxiety. Started with 6th grade and worsening after pandemic. Thinking she may need medications for it. Pt thinking his daughter has mutism.   Anxiety- taking citalopram $RemoveBeforeDEI'40mg'BUhyYeMWAoteHjxL$  and doing well.  Taking 1/2 -1 tab ativan daily prn. Some days doesn't need to take it. Last script was 08/11/20.  Was getting 30 tab monthly for ativan.  Medical History [place] has a past medical history of Anxiety, Hypertension, Insomnia, and Reflux.   Outpatient Encounter Medications as of 08/31/2020  Medication Sig  . amLODipine (NORVASC) 2.5 MG tablet Take 1 tablet (2.5 mg total) by mouth at bedtime.  . citalopram (CELEXA) 40 MG tablet Take 1 tablet (40 mg total) by mouth daily.  . enalapril (VASOTEC) 10 MG tablet TAKE (1) TABLET BY MOUTH DAILY.  Marland Kitchen LORazepam (ATIVAN) 1 MG tablet Take 1/2-1 tab p.o. q12hrs prn anxiety.  . [DISCONTINUED] citalopram (CELEXA) 40 MG tablet Take 1 tablet (40 mg total) by mouth daily.  . [DISCONTINUED] enalapril (VASOTEC) 10 MG tablet TAKE (1) TABLET BY MOUTH DAILY.  . [DISCONTINUED] etodolac (LODINE) 400 MG tablet Take one tablet po BID with food prn flare (Patient not taking: Reported on 08/31/2020)  . [DISCONTINUED] LORazepam (ATIVAN) 1 MG tablet Take 1/2-1 tab p.o. q12hrs prn anxiety.   No facility-administered encounter medications on file as of 08/31/2020.     Review of Systems  Constitutional: Negative for chills and fever.  HENT: Negative for congestion, rhinorrhea and sore throat.   Respiratory: Negative for cough, shortness of breath and wheezing.   Cardiovascular: Negative for chest pain and leg swelling.  Gastrointestinal: Negative for abdominal pain, diarrhea, nausea and vomiting.  Genitourinary: Negative for dysuria  and frequency.  Skin: Negative for rash.  Neurological: Negative for dizziness, weakness and headaches.  Psychiatric/Behavioral: Negative for behavioral problems, confusion, decreased concentration, dysphoric mood, hallucinations, self-injury, sleep disturbance and suicidal ideas. The patient is nervous/anxious (improved with meds). The patient is not hyperactive.      Vitals BP 124/90   Pulse 94   Temp (!) 97.1 F (36.2 C) (Oral)   Ht $R'6\' 2"'Pt$  (1.88 m)   Wt 239 lb 3.2 oz (108.5 kg)   SpO2 99%   BMI 30.71 kg/m   Objective:   Physical Exam Vitals and nursing note reviewed.  Constitutional:      General: He is not in acute distress.    Appearance: Normal appearance. He is not ill-appearing.  HENT:     Head: Normocephalic.     Nose: Nose normal. No congestion.     Mouth/Throat:     Mouth: Mucous membranes are moist.     Pharynx: No oropharyngeal exudate.  Eyes:     Extraocular Movements: Extraocular movements intact.     Conjunctiva/sclera: Conjunctivae normal.     Pupils: Pupils are equal, round, and reactive to light.  Cardiovascular:     Rate and Rhythm: Normal rate and regular rhythm.     Pulses: Normal pulses.     Heart sounds: Normal heart sounds. No murmur heard.   Pulmonary:     Effort: Pulmonary effort is normal.     Breath sounds: Normal breath sounds. No wheezing, rhonchi or rales.  Musculoskeletal:  General: Normal range of motion.     Right lower leg: No edema.     Left lower leg: No edema.  Skin:    General: Skin is warm and dry.     Findings: No rash.  Neurological:     General: No focal deficit present.     Mental Status: He is alert and oriented to person, place, and time.     Cranial Nerves: No cranial nerve deficit.  Psychiatric:        Mood and Affect: Mood normal.        Behavior: Behavior normal.        Thought Content: Thought content normal.        Judgment: Judgment normal.      Assessment and Plan   1. Essential  hypertension - CBC - CMP14+EGFR - Lipid panel    Anxiety- just taking ativan 1 tab or 1/2 tab prn daily. Refilled for 3 months.  htn- elevated will cont enalapril $RemoveBefore'10mg'EOtgavSfwCIcd$  am and added norvasc 2.$RemoveBeforeD'5mg'bWpajNtJzREEyX$  qhs.  Dec salt intake. Pt to get labs today.  Needs labs.  Ordered labs today.  F/u 48mo

## 2020-09-01 LAB — CMP14+EGFR
ALT: 44 IU/L (ref 0–44)
AST: 32 IU/L (ref 0–40)
Albumin/Globulin Ratio: 2 (ref 1.2–2.2)
Albumin: 5 g/dL (ref 4.0–5.0)
Alkaline Phosphatase: 62 IU/L (ref 44–121)
BUN/Creatinine Ratio: 8 — ABNORMAL LOW (ref 9–20)
BUN: 7 mg/dL (ref 6–24)
Bilirubin Total: 1.7 mg/dL — ABNORMAL HIGH (ref 0.0–1.2)
CO2: 24 mmol/L (ref 20–29)
Calcium: 10.2 mg/dL (ref 8.7–10.2)
Chloride: 95 mmol/L — ABNORMAL LOW (ref 96–106)
Creatinine, Ser: 0.93 mg/dL (ref 0.76–1.27)
GFR calc Af Amer: 115 mL/min/{1.73_m2} (ref >59)
GFR calc non Af Amer: 99 mL/min/{1.73_m2} (ref >59)
Globulin, Total: 2.5 g/dL (ref 1.5–4.5)
Glucose: 87 mg/dL (ref 65–99)
Potassium: 4.6 mmol/L (ref 3.5–5.2)
Sodium: 138 mmol/L (ref 134–144)
Total Protein: 7.5 g/dL (ref 6.0–8.5)

## 2020-09-01 LAB — CBC
Hematocrit: 48.6 % (ref 37.5–51.0)
Hemoglobin: 16.8 g/dL (ref 13.0–17.7)
MCH: 32.7 pg (ref 26.6–33.0)
MCHC: 34.6 g/dL (ref 31.5–35.7)
MCV: 95 fL (ref 79–97)
Platelets: 285 10*3/uL (ref 150–450)
RBC: 5.14 x10E6/uL (ref 4.14–5.80)
RDW: 11.8 % (ref 11.6–15.4)
WBC: 2.9 10*3/uL — ABNORMAL LOW (ref 3.4–10.8)

## 2020-09-01 LAB — LIPID PANEL
Chol/HDL Ratio: 2.5 ratio (ref 0.0–5.0)
Cholesterol, Total: 207 mg/dL — ABNORMAL HIGH (ref 100–199)
HDL: 84 mg/dL (ref >39)
LDL Chol Calc (NIH): 108 mg/dL — ABNORMAL HIGH (ref 0–99)
Triglycerides: 83 mg/dL (ref 0–149)
VLDL Cholesterol Cal: 15 mg/dL (ref 5–40)

## 2021-01-21 ENCOUNTER — Telehealth: Payer: Self-pay | Admitting: Family Medicine

## 2021-01-21 NOTE — Telephone Encounter (Signed)
Need appt for refills on ativan.  In next 30 days. Thx. Dr. Ladona Ridgel

## 2021-01-21 NOTE — Telephone Encounter (Signed)
Please contact patient to have him set up appt. Thank you! 

## 2021-01-28 NOTE — Telephone Encounter (Signed)
Appointment  3/16

## 2021-02-01 NOTE — Telephone Encounter (Signed)
Small amt sent on 01/21/21. Thx. Dr. Karie Schwalbe

## 2021-02-21 ENCOUNTER — Other Ambulatory Visit: Payer: Self-pay | Admitting: Family Medicine

## 2021-02-28 ENCOUNTER — Other Ambulatory Visit: Payer: Self-pay | Admitting: Family Medicine

## 2021-03-01 ENCOUNTER — Other Ambulatory Visit: Payer: Self-pay | Admitting: Family Medicine

## 2021-03-02 ENCOUNTER — Ambulatory Visit (INDEPENDENT_AMBULATORY_CARE_PROVIDER_SITE_OTHER): Payer: BC Managed Care – PPO | Admitting: Family Medicine

## 2021-03-02 ENCOUNTER — Other Ambulatory Visit: Payer: Self-pay

## 2021-03-02 VITALS — BP 130/84 | HR 80 | Temp 97.7°F | Ht 74.0 in | Wt 247.8 lb

## 2021-03-02 DIAGNOSIS — F419 Anxiety disorder, unspecified: Secondary | ICD-10-CM | POA: Diagnosis not present

## 2021-03-02 DIAGNOSIS — I10 Essential (primary) hypertension: Secondary | ICD-10-CM

## 2021-03-02 MED ORDER — AMLODIPINE BESYLATE 2.5 MG PO TABS: 2.5000 mg | ORAL_TABLET | Freq: Every day | ORAL | 1 refills | Status: DC

## 2021-03-02 MED ORDER — ENALAPRIL MALEATE 10 MG PO TABS: ORAL_TABLET | ORAL | 1 refills | Status: DC

## 2021-03-02 MED ORDER — LORAZEPAM 1 MG PO TABS: ORAL_TABLET | ORAL | 2 refills | Status: DC

## 2021-03-02 NOTE — Progress Notes (Signed)
Patient ID: Martin Sellers, male    DOB: March 30, 1976, 45 y.o.   MRN: [id number]   Chief Complaint  Patient presents with  . Hypertension    Follow up   Subjective:    HPI CC- htn and anxiety-  Anxiety-  Taking ativan about 1/2 tab per day.  Feeling like needing it in am. Doing well with celexa.  Feeling medications are controlling symptoms.  HTN Pt compliant with BP meds in am, but forgets sometimes to take the night dose 2.5mg  norvasc. No SEs Denies chest pain, sob, LE swelling, or blurry vision.    Medical History [place] has a past medical history of Anxiety, Hypertension, Insomnia, and Reflux.   Outpatient Encounter Medications as of 03/02/2021  Medication Sig  . amLODipine (NORVASC) 2.5 MG tablet Take 1 tablet (2.5 mg total) by mouth at bedtime.  . citalopram (CELEXA) 40 MG tablet TAKE ONE TABLET BY MOUTH ONCE DAILY  . enalapril (VASOTEC) 10 MG tablet TAKE (1) TABLET BY MOUTH DAILY.  Marland Kitchen LORazepam (ATIVAN) 1 MG tablet Take 1/2 tab p.o. daily prn.  . [DISCONTINUED] amLODipine (NORVASC) 2.5 MG tablet Take 1 tablet (2.5 mg total) by mouth at bedtime.  . [DISCONTINUED] enalapril (VASOTEC) 10 MG tablet TAKE (1) TABLET BY MOUTH DAILY.  . [DISCONTINUED] LORazepam (ATIVAN) 1 MG tablet TAKE 1/2 TO 1 TABLET BY MOUTH EVERY 12 HOURS AS NEEDED FOR ANXIETY   No facility-administered encounter medications on file as of 03/02/2021.     Review of Systems  Constitutional: Negative for chills and fever.  HENT: Negative for congestion, rhinorrhea and sore throat.   Respiratory: Negative for cough, shortness of breath and wheezing.   Cardiovascular: Negative for chest pain and leg swelling.  Gastrointestinal: Negative for abdominal pain, diarrhea, nausea and vomiting.  Genitourinary: Negative for dysuria and frequency.  Skin: Negative for rash.  Neurological: Negative for dizziness, weakness and headaches.  Psychiatric/Behavioral: Negative for dysphoric mood, sleep disturbance and  suicidal ideas. The patient is nervous/anxious (intermittent).      Vitals BP 130/84   Pulse 80   Temp 97.7 F (36.5 C) (Oral)   Ht 6\' 2"  (1.88 m)   Wt 247 lb 12.8 oz (112.4 kg)   SpO2 97%   BMI 31.82 kg/m   Objective:   Physical Exam Vitals and nursing note reviewed.  Constitutional:      General: He is not in acute distress.    Appearance: Normal appearance. He is not ill-appearing.  HENT:     Head: Normocephalic.     Nose: Nose normal. No congestion.     Mouth/Throat:     Mouth: Mucous membranes are moist.     Pharynx: No oropharyngeal exudate.  Eyes:     Extraocular Movements: Extraocular movements intact.     Conjunctiva/sclera: Conjunctivae normal.     Pupils: Pupils are equal, round, and reactive to light.  Cardiovascular:     Rate and Rhythm: Normal rate and regular rhythm.     Pulses: Normal pulses.     Heart sounds: Normal heart sounds. No murmur heard.   Pulmonary:     Effort: Pulmonary effort is normal.     Breath sounds: Normal breath sounds. No wheezing, rhonchi or rales.  Musculoskeletal:        General: Normal range of motion.     Right lower leg: No edema.     Left lower leg: No edema.  Skin:    General: Skin is warm and dry.     Findings:  No rash.  Neurological:     General: No focal deficit present.     Mental Status: He is alert and oriented to person, place, and time.     Cranial Nerves: No cranial nerve deficit.  Psychiatric:        Mood and Affect: Mood normal.        Behavior: Behavior normal.        Thought Content: Thought content normal.        Judgment: Judgment normal.      Assessment and Plan   1. Anxiety - LORazepam (ATIVAN) 1 MG tablet; Take 1/2 tab p.o. daily prn.  Dispense: 30 tablet; Refill: 2  2. Primary hypertension - enalapril (VASOTEC) 10 MG tablet; TAKE (1) TABLET BY MOUTH DAILY.  Dispense: 90 tablet; Refill: 1 - amLODipine (NORVASC) 2.5 MG tablet; Take 1 tablet (2.5 mg total) by mouth at bedtime.  Dispense:  90 tablet; Refill: 1   Anxiety- table. Taking ativan prn.  htn- stable, cont meds.   Return in about 6 months (around 09/02/2021) for f/u htn, anxiety.

## 2021-04-25 ENCOUNTER — Telehealth: Payer: Self-pay

## 2021-04-25 DIAGNOSIS — F419 Anxiety disorder, unspecified: Secondary | ICD-10-CM

## 2021-04-25 MED ORDER — LORAZEPAM 0.5 MG PO TABS: ORAL_TABLET | ORAL | 2 refills | Status: DC

## 2021-04-25 NOTE — Telephone Encounter (Signed)
Pt medication LORazepam (ATIVAN) 1 MG tablet he has to take 1/2 mg each day when he goes to cut it so small if crushes so now he is short and he needs refill and they can not fill till Thursday.  [facility] - [place], [place] - [address] Pt still has a pill left he will be one day short   Pt call back [phone]

## 2021-04-25 NOTE — Telephone Encounter (Signed)
Patient notified and verbalized understanding. 

## 2021-05-31 ENCOUNTER — Telehealth: Payer: Self-pay | Admitting: Family Medicine

## 2021-05-31 ENCOUNTER — Other Ambulatory Visit: Payer: Self-pay | Admitting: Family Medicine

## 2021-05-31 NOTE — Telephone Encounter (Signed)
Prescription sent electronically to pharmacy. Patient notified. 

## 2021-05-31 NOTE — Telephone Encounter (Signed)
Patient is requesting refill on citalopram 40 mg. He states completely out. [employer]

## 2021-08-26 ENCOUNTER — Other Ambulatory Visit: Payer: Self-pay | Admitting: Family Medicine

## 2021-08-29 ENCOUNTER — Other Ambulatory Visit: Payer: Self-pay | Admitting: Family Medicine

## 2021-08-29 DIAGNOSIS — I10 Essential (primary) hypertension: Secondary | ICD-10-CM

## 2021-09-06 ENCOUNTER — Other Ambulatory Visit: Payer: Self-pay | Admitting: Family Medicine

## 2021-09-06 DIAGNOSIS — F419 Anxiety disorder, unspecified: Secondary | ICD-10-CM

## 2021-10-10 ENCOUNTER — Other Ambulatory Visit: Payer: Self-pay | Admitting: Nurse Practitioner

## 2021-10-10 DIAGNOSIS — F419 Anxiety disorder, unspecified: Secondary | ICD-10-CM

## 2021-11-02 ENCOUNTER — Telehealth: Payer: Self-pay | Admitting: Family Medicine

## 2021-11-02 ENCOUNTER — Other Ambulatory Visit: Payer: Self-pay | Admitting: Family Medicine

## 2021-11-02 DIAGNOSIS — F419 Anxiety disorder, unspecified: Secondary | ICD-10-CM

## 2021-11-02 DIAGNOSIS — I10 Essential (primary) hypertension: Secondary | ICD-10-CM

## 2021-11-02 MED ORDER — LORAZEPAM 0.5 MG PO TABS
0.5000 mg | ORAL_TABLET | Freq: Every day | ORAL | 3 refills | Status: DC | PRN
Start: 2021-11-02 — End: 2022-02-08

## 2021-11-02 MED ORDER — ENALAPRIL MALEATE 10 MG PO TABS: ORAL_TABLET | ORAL | 1 refills | Status: DC

## 2021-11-02 NOTE — Telephone Encounter (Signed)
Pt was advised if refill appropriate we would send it in, if not we will call.

## 2021-11-02 NOTE — Telephone Encounter (Signed)
Patient is requesting refill on enalapril 10 mg called into [facility] has appointment 12/7 and completely out

## 2021-11-02 NOTE — Telephone Encounter (Signed)
Enalapril sent to pharmacy. Pt would also like refill on Lorazepam-enough to get him to his appt. [employer]. Please advise. Thank you

## 2021-11-23 ENCOUNTER — Other Ambulatory Visit: Payer: Self-pay

## 2021-11-23 ENCOUNTER — Ambulatory Visit: Payer: BC Managed Care – PPO | Admitting: Family Medicine

## 2021-11-23 VITALS — BP 141/95 | HR 84 | Ht 74.0 in | Wt 254.0 lb

## 2021-11-23 DIAGNOSIS — I10 Essential (primary) hypertension: Secondary | ICD-10-CM

## 2021-11-23 DIAGNOSIS — K760 Fatty (change of) liver, not elsewhere classified: Secondary | ICD-10-CM

## 2021-11-23 DIAGNOSIS — F419 Anxiety disorder, unspecified: Secondary | ICD-10-CM

## 2021-11-23 MED ORDER — ENALAPRIL MALEATE 10 MG PO TABS: ORAL_TABLET | ORAL | 3 refills | Status: DC

## 2021-11-23 MED ORDER — AMLODIPINE BESYLATE 2.5 MG PO TABS: 2.5000 mg | ORAL_TABLET | Freq: Every day | ORAL | 1 refills | Status: DC

## 2021-11-23 MED ORDER — CITALOPRAM HYDROBROMIDE 40 MG PO TABS: 40.0000 mg | ORAL_TABLET | Freq: Every day | ORAL | 3 refills | Status: DC

## 2021-11-23 NOTE — Assessment & Plan Note (Signed)
Uncontrolled.  Continue enalapril.  Advised to take amlodipine with his morning meds.

## 2021-11-23 NOTE — Assessment & Plan Note (Signed)
Discussed need for ultrasound.  Will discuss this again next year.  Most recent LFTs were normal.

## 2021-11-23 NOTE — Assessment & Plan Note (Signed)
Stable.  Continue Celexa and lorazepam.  Refilled today.

## 2021-11-23 NOTE — Progress Notes (Signed)
Subjective:  Patient ID: Martin Sellers, male    DOB: [birth year]  Age: 45 y.o. MRN: [id number]  CC: Chief Complaint  Patient presents with   Establish Care    Needs refill on medication    HPI:  45 year old male with anxiety, hepatic steatosis, hypertension, presents for follow-up.  Patient states that his anxiety is well controlled.  He is currently on Celexa and lorazepam.  Doing well at this time.  Needs refills.  Patient has known hepatic steatosis.  Most recent LFTs were normal.  He has not had an ultrasound since 2013.  We will discuss this today.  Patient does drink alcohol on a regular basis.  Patient's hypertension is uncontrolled at this time.  Patient states that he has difficulty remembering to take his Norvasc.  He states that this was prescribed to be taken at night.  I have advised him that he can take this in the morning with his other medication.  He is on enalapril as well.   Patient Active Problem List   Diagnosis Date Noted   Insomnia 01/07/2015   Perennial allergic rhinitis 10/27/2013   Fatty liver 10/27/2013   Hypertension    Anxiety     Social Hx   Social History   Socioeconomic History   Marital status: Divorced    Spouse name: Not on file   Number of children: Not on file   Years of education: Not on file   Highest education level: Not on file  Occupational History   Not on file  Tobacco Use   Smoking status: Never   Smokeless tobacco: Never  Substance and Sexual Activity   Alcohol use: No   Drug use: No   Sexual activity: Not on file  Other Topics Concern   Not on file  Social History Narrative   Not on file   Social Determinants of Health   Financial Resource Strain: Not on file  Food Insecurity: Not on file  Transportation Needs: Not on file  Physical Activity: Not on file  Stress: Not on file  Social Connections: Not on file    Review of Systems  Constitutional: Negative.   Respiratory: Negative.    Cardiovascular:  Negative.     Objective:  BP (!) 141/95   Pulse 84   Ht 6\' 2"  (1.88 m)   Wt 254 lb (115.2 kg)   SpO2 95%   BMI 32.61 kg/m   BP/Weight 11/23/2021 03/02/2021 08/31/2020  Systolic BP 141 130 124  Diastolic BP 95 84 90  Wt. (Lbs) 254 247.8 239.2  BMI 32.61 31.82 30.71    Physical Exam Constitutional:      General: He is not in acute distress.    Appearance: Normal appearance. He is not ill-appearing.  HENT:     Head: Normocephalic and atraumatic.  Eyes:     General:        Right eye: No discharge.        Left eye: No discharge.     Conjunctiva/sclera: Conjunctivae normal.  Cardiovascular:     Rate and Rhythm: Normal rate and regular rhythm.     Heart sounds: No murmur heard. Pulmonary:     Effort: Pulmonary effort is normal.     Breath sounds: Normal breath sounds. No wheezing, rhonchi or rales.  Abdominal:     General: There is no distension.     Palpations: Abdomen is soft.     Tenderness: There is no abdominal tenderness.  Neurological:     Mental  Status: He is alert.  Psychiatric:        Mood and Affect: Mood normal.        Behavior: Behavior normal.    Lab Results  Component Value Date   WBC 2.9 (L) 08/31/2020   HGB 16.8 08/31/2020   HCT 48.6 08/31/2020   PLT 285 08/31/2020   GLUCOSE 87 08/31/2020   CHOL 207 (H) 08/31/2020   TRIG 83 08/31/2020   HDL 84 08/31/2020   LDLCALC 108 (H) 08/31/2020   ALT 44 08/31/2020   AST 32 08/31/2020   NA 138 08/31/2020   K 4.6 08/31/2020   CL 95 (L) 08/31/2020   CREATININE 0.93 08/31/2020   BUN 7 08/31/2020   CO2 24 08/31/2020     Assessment & Plan:   Problem List Items Addressed This Visit       Cardiovascular and Mediastinum   Hypertension - Primary    Uncontrolled.  Continue enalapril.  Advised to take amlodipine with his morning meds.      Relevant Medications   enalapril (VASOTEC) 10 MG tablet   amLODipine (NORVASC) 2.5 MG tablet     Digestive   Fatty liver    Discussed need for ultrasound.  Will  discuss this again next year.  Most recent LFTs were normal.        Other   Anxiety    Stable.  Continue Celexa and lorazepam.  Refilled today.      Relevant Medications   citalopram (CELEXA) 40 MG tablet    Meds ordered this encounter  Medications   citalopram (CELEXA) 40 MG tablet    Sig: Take 1 tablet (40 mg total) by mouth daily.    Dispense:  90 tablet    Refill:  3   enalapril (VASOTEC) 10 MG tablet    Sig: TAKE (1) TABLET BY MOUTH ONCE DAILY.    Dispense:  90 tablet    Refill:  3   amLODipine (NORVASC) 2.5 MG tablet    Sig: Take 1 tablet (2.5 mg total) by mouth at bedtime.    Dispense:  90 tablet    Refill:  1    Follow-up:  Return in about 6 months (around 05/24/2022) for HTN follow up.  Everlene Other DO [facility]

## 2021-11-23 NOTE — Patient Instructions (Signed)
If you need refills in the future let me know.  Follow up in 6 months.  Consider colonoscopy.  Take care  Dr. Adriana Simas

## 2022-02-08 ENCOUNTER — Other Ambulatory Visit: Payer: Self-pay | Admitting: Family Medicine

## 2022-02-08 DIAGNOSIS — F419 Anxiety disorder, unspecified: Secondary | ICD-10-CM

## 2022-05-24 ENCOUNTER — Encounter: Payer: Self-pay | Admitting: Family Medicine

## 2022-05-24 ENCOUNTER — Ambulatory Visit: Payer: BC Managed Care – PPO | Admitting: Family Medicine

## 2022-05-24 VITALS — BP 129/87 | HR 75 | Temp 97.5°F | Ht 74.0 in | Wt 264.0 lb

## 2022-05-24 DIAGNOSIS — F419 Anxiety disorder, unspecified: Secondary | ICD-10-CM

## 2022-05-24 DIAGNOSIS — K760 Fatty (change of) liver, not elsewhere classified: Secondary | ICD-10-CM | POA: Diagnosis not present

## 2022-05-24 DIAGNOSIS — I10 Essential (primary) hypertension: Secondary | ICD-10-CM

## 2022-05-24 DIAGNOSIS — E785 Hyperlipidemia, unspecified: Secondary | ICD-10-CM

## 2022-05-24 DIAGNOSIS — Z130 Encounter for screening for diseases of the blood and blood-forming organs and certain disorders involving the immune mechanism: Secondary | ICD-10-CM

## 2022-05-24 MED ORDER — ENALAPRIL MALEATE 10 MG PO TABS: ORAL_TABLET | ORAL | 3 refills | Status: DC

## 2022-05-24 MED ORDER — LORAZEPAM 0.5 MG PO TABS: 0.5000 mg | ORAL_TABLET | Freq: Every day | ORAL | 3 refills | Status: DC | PRN

## 2022-05-24 MED ORDER — AMLODIPINE BESYLATE 2.5 MG PO TABS: 2.5000 mg | ORAL_TABLET | Freq: Every day | ORAL | 3 refills | Status: DC

## 2022-05-24 MED ORDER — CITALOPRAM HYDROBROMIDE 40 MG PO TABS
40.0000 mg | ORAL_TABLET | Freq: Every day | ORAL | 3 refills | Status: DC
Start: 2022-05-24 — End: 2023-03-29

## 2022-05-24 NOTE — Patient Instructions (Signed)
We will call regarding the Ultrasound.  Continue your medications.  Labs today.  Follow up in 6 months.

## 2022-05-24 NOTE — Assessment & Plan Note (Signed)
Ultrasound ordered for reassessment/evaluation.

## 2022-05-24 NOTE — Progress Notes (Signed)
Subjective:  Patient ID: Martin Sellers, male    DOB: [birth year]  Age: 46 y.o. MRN: [id number]  CC: Chief Complaint  Patient presents with   Hypertension   Anxiety    HPI:  46 year old male presents for follow-up.  Hypertension is stable on enalapril and amlodipine.  Anxiety stable on Celexa and lorazepam.  Patient has documented hepatic steatosis.  Last ultrasound was in 2013.  Needs ultrasound to reevaluate.  We will discuss this today.  Patient reports that he is having some back pain.  No other complaints or concerns at this time.  Patient Active Problem List   Diagnosis Date Noted   Insomnia 01/07/2015   Perennial allergic rhinitis 10/27/2013   Fatty liver 10/27/2013   Hypertension    Anxiety     Social Hx   Social History   Socioeconomic History   Marital status: Divorced    Spouse name: Not on file   Number of children: Not on file   Years of education: Not on file   Highest education level: Not on file  Occupational History   Not on file  Tobacco Use   Smoking status: Never   Smokeless tobacco: Never  Substance and Sexual Activity   Alcohol use: No   Drug use: No   Sexual activity: Not on file  Other Topics Concern   Not on file  Social History Narrative   Not on file   Social Determinants of Health   Financial Resource Strain: Not on file  Food Insecurity: Not on file  Transportation Needs: Not on file  Physical Activity: Not on file  Stress: Not on file  Social Connections: Not on file    Review of Systems  Constitutional: Negative.   Musculoskeletal:  Positive for back pain.    Objective:  BP 129/87   Pulse 75   Temp (!) 97.5 F (36.4 C)   Ht $R'6\' 2"'So$  (1.88 m)   Wt 264 lb (119.7 kg)   SpO2 98%   BMI 33.90 kg/m      05/24/2022   10:37 AM 11/23/2021   10:40 AM 03/02/2021    8:58 AM  BP/Weight  Systolic BP 314 970 263  Diastolic BP 87 95 84  Wt. (Lbs) 264 254 247.8  BMI 33.9 kg/m2 32.61 kg/m2 31.82 kg/m2    Physical  Exam Vitals and nursing note reviewed.  Constitutional:      General: He is not in acute distress.    Appearance: Normal appearance. He is not ill-appearing.  Eyes:     General:        Right eye: No discharge.        Left eye: No discharge.     Conjunctiva/sclera: Conjunctivae normal.  Cardiovascular:     Rate and Rhythm: Normal rate and regular rhythm.  Pulmonary:     Effort: Pulmonary effort is normal.     Breath sounds: Normal breath sounds. No wheezing, rhonchi or rales.  Neurological:     Mental Status: He is alert.  Psychiatric:        Mood and Affect: Mood normal.        Behavior: Behavior normal.    Lab Results  Component Value Date   WBC 2.9 (L) 08/31/2020   HGB 16.8 08/31/2020   HCT 48.6 08/31/2020   PLT 285 08/31/2020   GLUCOSE 87 08/31/2020   CHOL 207 (H) 08/31/2020   TRIG 83 08/31/2020   HDL 84 08/31/2020   LDLCALC 108 (H) 08/31/2020   ALT  44 08/31/2020   AST 32 08/31/2020   NA 138 08/31/2020   K 4.6 08/31/2020   CL 95 (L) 08/31/2020   CREATININE 0.93 08/31/2020   BUN 7 08/31/2020   CO2 24 08/31/2020     Assessment & Plan:   Problem List Items Addressed This Visit       Cardiovascular and Mediastinum   Hypertension    Stable on enalapril and amlodipine.  Continue.       Relevant Medications   amLODipine (NORVASC) 2.5 MG tablet   enalapril (VASOTEC) 10 MG tablet     Digestive   Fatty liver    Ultrasound ordered for reassessment/evaluation.       Relevant Orders   CMP14+EGFR   US ABDOMEN RUQ W/ELASTOGRAPHY     Other   Anxiety    Stable on Celexa and Ativan.  Continue.       Relevant Medications   citalopram (CELEXA) 40 MG tablet   LORazepam (ATIVAN) 0.5 MG tablet   Other Visit Diagnoses     Hyperlipidemia, unspecified hyperlipidemia type    -  Primary   Relevant Medications   amLODipine (NORVASC) 2.5 MG tablet   enalapril (VASOTEC) 10 MG tablet   Other Relevant Orders   Lipid panel   Screening for deficiency anemia        Relevant Orders   CBC       Meds ordered this encounter  Medications   amLODipine (NORVASC) 2.5 MG tablet    Sig: Take 1 tablet (2.5 mg total) by mouth at bedtime.    Dispense:  90 tablet    Refill:  3   citalopram (CELEXA) 40 MG tablet    Sig: Take 1 tablet (40 mg total) by mouth daily.    Dispense:  90 tablet    Refill:  3   enalapril (VASOTEC) 10 MG tablet    Sig: TAKE (1) TABLET BY MOUTH ONCE DAILY.    Dispense:  90 tablet    Refill:  3   LORazepam (ATIVAN) 0.5 MG tablet    Sig: Take 1 tablet (0.5 mg total) by mouth daily as needed.    Dispense:  30 tablet    Refill:  3    Follow-up:  6 months  [place]

## 2022-05-24 NOTE — Assessment & Plan Note (Signed)
Stable on Celexa and Ativan.  Continue.

## 2022-05-24 NOTE — Assessment & Plan Note (Signed)
Stable on enalapril and amlodipine.  Continue.

## 2022-05-25 LAB — CMP14+EGFR
ALT: 43 IU/L (ref 0–44)
AST: 34 IU/L (ref 0–40)
Albumin/Globulin Ratio: 2.1 (ref 1.2–2.2)
Albumin: 4.9 g/dL (ref 4.0–5.0)
Alkaline Phosphatase: 61 IU/L (ref 44–121)
BUN/Creatinine Ratio: 12 (ref 9–20)
BUN: 11 mg/dL (ref 6–24)
Bilirubin Total: 1.2 mg/dL (ref 0.0–1.2)
CO2: 26 mmol/L (ref 20–29)
Calcium: 10.3 mg/dL — ABNORMAL HIGH (ref 8.7–10.2)
Chloride: 98 mmol/L (ref 96–106)
Creatinine, Ser: 0.93 mg/dL (ref 0.76–1.27)
Globulin, Total: 2.3 g/dL (ref 1.5–4.5)
Glucose: 97 mg/dL (ref 70–99)
Potassium: 5.1 mmol/L (ref 3.5–5.2)
Sodium: 140 mmol/L (ref 134–144)
Total Protein: 7.2 g/dL (ref 6.0–8.5)
eGFR: 103 mL/min/{1.73_m2} (ref >59)

## 2022-05-25 LAB — CBC
Hematocrit: 47.4 % (ref 37.5–51.0)
Hemoglobin: 16.5 g/dL (ref 13.0–17.7)
MCH: 32.9 pg (ref 26.6–33.0)
MCHC: 34.8 g/dL (ref 31.5–35.7)
MCV: 94 fL (ref 79–97)
Platelets: 276 10*3/uL (ref 150–450)
RBC: 5.02 x10E6/uL (ref 4.14–5.80)
RDW: 11.9 % (ref 11.6–15.4)
WBC: 3.7 10*3/uL (ref 3.4–10.8)

## 2022-05-25 LAB — LIPID PANEL
Chol/HDL Ratio: 3.1 ratio (ref 0.0–5.0)
Cholesterol, Total: 214 mg/dL — ABNORMAL HIGH (ref 100–199)
HDL: 70 mg/dL (ref >39)
LDL Chol Calc (NIH): 130 mg/dL — ABNORMAL HIGH (ref 0–99)
Triglycerides: 81 mg/dL (ref 0–149)
VLDL Cholesterol Cal: 14 mg/dL (ref 5–40)

## 2022-06-09 ENCOUNTER — Ambulatory Visit (HOSPITAL_COMMUNITY): Payer: BC Managed Care – PPO

## 2022-06-30 ENCOUNTER — Ambulatory Visit (HOSPITAL_COMMUNITY): Payer: BC Managed Care – PPO

## 2022-10-02 ENCOUNTER — Other Ambulatory Visit: Payer: Self-pay | Admitting: Family Medicine

## 2022-10-02 DIAGNOSIS — F419 Anxiety disorder, unspecified: Secondary | ICD-10-CM

## 2022-10-30 ENCOUNTER — Other Ambulatory Visit: Payer: Self-pay | Admitting: Family Medicine

## 2022-10-30 DIAGNOSIS — F419 Anxiety disorder, unspecified: Secondary | ICD-10-CM

## 2022-12-01 ENCOUNTER — Other Ambulatory Visit: Payer: Self-pay | Admitting: Family Medicine

## 2022-12-01 DIAGNOSIS — F419 Anxiety disorder, unspecified: Secondary | ICD-10-CM

## 2023-01-22 ENCOUNTER — Other Ambulatory Visit: Payer: Self-pay | Admitting: Family Medicine

## 2023-01-22 DIAGNOSIS — I10 Essential (primary) hypertension: Secondary | ICD-10-CM

## 2023-03-19 ENCOUNTER — Other Ambulatory Visit: Payer: Self-pay | Admitting: Family Medicine

## 2023-03-19 DIAGNOSIS — I10 Essential (primary) hypertension: Secondary | ICD-10-CM

## 2023-03-22 ENCOUNTER — Telehealth: Payer: Self-pay | Admitting: Family Medicine

## 2023-03-22 DIAGNOSIS — I10 Essential (primary) hypertension: Secondary | ICD-10-CM

## 2023-03-22 NOTE — Telephone Encounter (Signed)
Patient scheduled appointment  on 4/11 for medication follow up, he will be taking last pill tomorrow and needing enough medication until seen on amLODipine (NORVASC) 2.5 MG tablet  sent to [facility]. (He was last seen 02/21/2022)

## 2023-03-23 MED ORDER — AMLODIPINE BESYLATE 2.5 MG PO TABS: 2.5000 mg | ORAL_TABLET | Freq: Every day | ORAL | 0 refills | Status: DC

## 2023-03-23 NOTE — Telephone Encounter (Signed)
Prescription sent electronically to pharmacy. Patient notified. 

## 2023-03-29 ENCOUNTER — Encounter: Payer: Self-pay | Admitting: Family Medicine

## 2023-03-29 ENCOUNTER — Ambulatory Visit: Payer: BC Managed Care – PPO | Admitting: Family Medicine

## 2023-03-29 DIAGNOSIS — E785 Hyperlipidemia, unspecified: Secondary | ICD-10-CM | POA: Diagnosis not present

## 2023-03-29 DIAGNOSIS — Z130 Encounter for screening for diseases of the blood and blood-forming organs and certain disorders involving the immune mechanism: Secondary | ICD-10-CM

## 2023-03-29 DIAGNOSIS — I10 Essential (primary) hypertension: Secondary | ICD-10-CM | POA: Diagnosis not present

## 2023-03-29 DIAGNOSIS — F419 Anxiety disorder, unspecified: Secondary | ICD-10-CM

## 2023-03-29 DIAGNOSIS — K760 Fatty (change of) liver, not elsewhere classified: Secondary | ICD-10-CM | POA: Diagnosis not present

## 2023-03-29 DIAGNOSIS — Z1211 Encounter for screening for malignant neoplasm of colon: Secondary | ICD-10-CM

## 2023-03-29 MED ORDER — CITALOPRAM HYDROBROMIDE 40 MG PO TABS: 40.0000 mg | ORAL_TABLET | Freq: Every day | ORAL | 3 refills | Status: DC

## 2023-03-29 MED ORDER — LORAZEPAM 0.5 MG PO TABS: ORAL_TABLET | ORAL | 5 refills | Status: DC

## 2023-03-29 MED ORDER — AMLODIPINE BESYLATE 2.5 MG PO TABS: 2.5000 mg | ORAL_TABLET | Freq: Every day | ORAL | 3 refills | Status: DC

## 2023-03-29 MED ORDER — ENALAPRIL MALEATE 10 MG PO TABS: ORAL_TABLET | ORAL | 3 refills | Status: DC

## 2023-03-29 NOTE — Assessment & Plan Note (Signed)
Reassessing lipids today. 

## 2023-03-29 NOTE — Assessment & Plan Note (Signed)
Well-controlled.  Continue amlodipine and enalapril.  Refilled today.  Labs today.

## 2023-03-29 NOTE — Assessment & Plan Note (Signed)
Stable.  Continue Celexa and lorazepam.  Refilled today. 

## 2023-03-29 NOTE — Progress Notes (Signed)
Subjective:  Patient ID: Martin Sellers, male    DOB: Dec 22, 1975  Age: 47 y.o. MRN: [id number]  CC: Chief Complaint  Patient presents with   Hypertension    Follow up   Anxiety    Follow up    HPI:  47 year old male with hypertension, hepatic steatosis, anxiety and insomnia presents for follow-up.  Hypertension is well-controlled on amlodipine and enalapril..  Needs refills.  Anxiety stable on Celexa and as needed lorazepam.  Needs refills.  Patient is feeling well.  No other complaints or concerns at this time.  In regards to patient's preventative health care, he is in need of colonoscopy.  He declines but is amenable to Cologuard.  Declines HIV and hepatitis C screening.  Patient Active Problem List   Diagnosis Date Noted   Hyperlipidemia 03/29/2023   Insomnia 01/07/2015   Perennial allergic rhinitis 10/27/2013   Fatty liver 10/27/2013   Hypertension    Anxiety     Social Hx   Social History   Socioeconomic History   Marital status: Divorced    Spouse name: Not on file   Number of children: Not on file   Years of education: Not on file   Highest education level: Not on file  Occupational History   Not on file  Tobacco Use   Smoking status: Never   Smokeless tobacco: Never  Substance and Sexual Activity   Alcohol use: No   Drug use: No   Sexual activity: Not on file  Other Topics Concern   Not on file  Social History Narrative   Not on file   Social Determinants of Health   Financial Resource Strain: Not on file  Food Insecurity: Not on file  Transportation Needs: Not on file  Physical Activity: Not on file  Stress: Not on file  Social Connections: Not on file    Review of Systems  Respiratory: Negative.    Cardiovascular: Negative.      Objective:  BP 122/84   Pulse 78   Temp 98.2 F (36.8 C)   Ht 6\' 2"  (1.88 m)   Wt 275 lb (124.7 kg)   SpO2 97%   BMI 35.31 kg/m      03/29/2023    8:48 AM 03/29/2023    8:31 AM 05/24/2022   10:37 AM   BP/Weight  Systolic BP 122 132 129  Diastolic BP 84 96 87  Wt. (Lbs)  275 264  BMI  35.31 kg/m2 33.9 kg/m2    Physical Exam Vitals and nursing note reviewed.  Constitutional:      General: He is not in acute distress.    Appearance: Normal appearance.  HENT:     Head: Normocephalic and atraumatic.  Eyes:     General:        Right eye: No discharge.        Left eye: No discharge.     Conjunctiva/sclera: Conjunctivae normal.  Cardiovascular:     Rate and Rhythm: Normal rate and regular rhythm.  Pulmonary:     Effort: Pulmonary effort is normal.     Breath sounds: Normal breath sounds. No wheezing, rhonchi or rales.  Neurological:     Mental Status: He is alert.  Psychiatric:        Mood and Affect: Mood normal.        Behavior: Behavior normal.     Lab Results  Component Value Date   WBC 3.7 05/24/2022   HGB 16.5 05/24/2022   HCT 47.4 05/24/2022  PLT 276 05/24/2022   GLUCOSE 97 05/24/2022   CHOL 214 (H) 05/24/2022   TRIG 81 05/24/2022   HDL 70 05/24/2022   LDLCALC 130 (H) 05/24/2022   ALT 43 05/24/2022   AST 34 05/24/2022   NA 140 05/24/2022   K 5.1 05/24/2022   CL 98 05/24/2022   CREATININE 0.93 05/24/2022   BUN 11 05/24/2022   CO2 26 05/24/2022     Assessment & Plan:   Problem List Items Addressed This Visit       Cardiovascular and Mediastinum   Hypertension    Well-controlled.  Continue amlodipine and enalapril.  Refilled today.  Labs today.      Relevant Medications   amLODipine (NORVASC) 2.5 MG tablet   enalapril (VASOTEC) 10 MG tablet     Digestive   Fatty liver   Relevant Orders   CMP14+EGFR     Other   Hyperlipidemia    Reassessing lipids today.      Relevant Medications   amLODipine (NORVASC) 2.5 MG tablet   enalapril (VASOTEC) 10 MG tablet   Other Relevant Orders   Lipid panel   Anxiety    Stable.  Continue Celexa and lorazepam.  Refilled today.      Relevant Medications   citalopram (CELEXA) 40 MG tablet   LORazepam  (ATIVAN) 0.5 MG tablet   Other Visit Diagnoses     Screening for deficiency anemia       Relevant Orders   CBC   Colon cancer screening       Relevant Orders   Cologuard       Meds ordered this encounter  Medications   amLODipine (NORVASC) 2.5 MG tablet    Sig: Take 1 tablet (2.5 mg total) by mouth at bedtime.    Dispense:  90 tablet    Refill:  3   citalopram (CELEXA) 40 MG tablet    Sig: Take 1 tablet (40 mg total) by mouth daily.    Dispense:  90 tablet    Refill:  3   enalapril (VASOTEC) 10 MG tablet    Sig: TAKE (1) TABLET BY MOUTH ONCE DAILY.    Dispense:  90 tablet    Refill:  3   LORazepam (ATIVAN) 0.5 MG tablet    Sig: TAKE (1) TABLET BY MOUTH ONCE DAILY AS NEEDED.    Dispense:  30 tablet    Refill:  5    Follow-up:  Return in about 6 months (around 09/28/2023).  Everlene Other DO [facility]

## 2023-03-29 NOTE — Patient Instructions (Signed)
Continue your meds.  Labs today.  Follow up in 6 months.

## 2023-09-18 ENCOUNTER — Other Ambulatory Visit: Payer: Self-pay | Admitting: Family Medicine

## 2023-09-18 DIAGNOSIS — F419 Anxiety disorder, unspecified: Secondary | ICD-10-CM

## 2023-09-26 ENCOUNTER — Other Ambulatory Visit: Payer: Self-pay

## 2023-09-26 DIAGNOSIS — F419 Anxiety disorder, unspecified: Secondary | ICD-10-CM

## 2023-09-26 NOTE — Telephone Encounter (Signed)
Prescription Request  09/26/2023  LOV: Visit date not found  What is the name of the medication or equipment? LORazepam (ATIVAN) 0.5 MG tablet   Have you contacted your pharmacy to request a refill? Yes   Which pharmacy would you like this sent to?  [facility] - [place], [place] - [address] [place] [address] Phone: [phone] Fax: [phone]    Patient notified that their request is being sent to the clinical staff for review and that they should receive a response within 2 business days.   Please advise at Mobile [phone] (mobile)

## 2023-09-27 ENCOUNTER — Other Ambulatory Visit: Payer: Self-pay

## 2023-09-27 ENCOUNTER — Other Ambulatory Visit: Payer: Self-pay | Admitting: Family Medicine

## 2023-09-27 DIAGNOSIS — F419 Anxiety disorder, unspecified: Secondary | ICD-10-CM

## 2023-09-27 MED ORDER — LORAZEPAM 0.5 MG PO TABS
ORAL_TABLET | ORAL | 0 refills | Status: DC
Start: 2023-09-27 — End: 2023-10-26

## 2023-09-28 ENCOUNTER — Ambulatory Visit: Payer: BC Managed Care – PPO | Admitting: Family Medicine

## 2023-10-25 ENCOUNTER — Other Ambulatory Visit: Payer: Self-pay | Admitting: Family Medicine

## 2023-10-25 DIAGNOSIS — F419 Anxiety disorder, unspecified: Secondary | ICD-10-CM

## 2023-10-25 NOTE — Telephone Encounter (Signed)
Copied from CRM [id number]. Topic: Clinical - Medication Refill >> Oct 25, 2023 10:57 AM Deaijah H wrote: Most Recent Primary Care Visit:  Provider: Tommie Sams  Department: [facility]  Visit Type: OFFICE VISIT  Date: 03/29/2023  Medication: ***  Has the patient contacted their pharmacy? Yes (Agent: If no, request that the patient contact the pharmacy for the refill. If patient does not wish to contact the pharmacy document the reason why and proceed with request.) (Agent: If yes, when and what did the pharmacy advise?)  Is this the correct pharmacy for this prescription? Yes If no, delete pharmacy and type the correct one.  This is the patient's preferred pharmacy:  [facility] - [place], [place] - [address] [place] [address] Phone: [phone] Fax: [phone]   Has the prescription been filled recently? No  Is the patient out of the medication? No  Has the patient been seen for an appointment in the last year OR does the patient have an upcoming appointment? yes  Can we respond through MyChart? No  Agent: Please be advised that Rx refills may take up to 3 business days. We ask that you follow-up with your pharmacy.

## 2023-10-25 NOTE — Telephone Encounter (Signed)
Copied from CRM [id number]. Topic: Clinical - Medication Refill >> Oct 25, 2023 11:19 AM Cassiday T wrote: Most Recent Primary Care Visit:  Provider: Tommie Sams  Department: [facility]  Visit Type: OFFICE VISIT  Date: 03/29/2023  Medication: ***  Has the patient contacted their pharmacy?  (Agent: If no, request that the patient contact the pharmacy for the refill. If patient does not wish to contact the pharmacy document the reason why and proceed with request.) (Agent: If yes, when and what did the pharmacy advise?)  Is this the correct pharmacy for this prescription?  If no, delete pharmacy and type the correct one.  This is the patient's preferred pharmacy:  [facility] - [place], [place] - [address] [place] [address] Phone: [phone] Fax: [phone]   Has the prescription been filled recently?   Is the patient out of the medication?   Has the patient been seen for an appointment in the last year OR does the patient have an upcoming appointment?   Can we respond through MyChart?   Agent: Please be advised that Rx refills may take up to 3 business days. We ask that you follow-up with your pharmacy.

## 2023-10-26 ENCOUNTER — Other Ambulatory Visit: Payer: Self-pay | Admitting: Family Medicine

## 2023-10-26 ENCOUNTER — Telehealth: Payer: Self-pay | Admitting: *Deleted

## 2023-10-26 DIAGNOSIS — F419 Anxiety disorder, unspecified: Secondary | ICD-10-CM

## 2023-10-26 MED ORDER — LORAZEPAM 0.5 MG PO TABS
ORAL_TABLET | ORAL | 3 refills | Status: DC
Start: 2023-10-26 — End: 2024-02-04

## 2023-10-26 NOTE — Telephone Encounter (Signed)
Cook, Jayce G, DO     Done   

## 2023-10-26 NOTE — Telephone Encounter (Signed)
Source  Martin Sellers (Patient)   Subject  Martin Sellers (Patient)   Topic  Clinical - Prescription Issue    Communication  Reason for CRM: Patient stated his prescription hasn't been faxed to pharmacy(Ativan) and he will be out of medication on hand over the weekend and would like it to be rushed by Dr. Adriana Simas.

## 2023-10-26 NOTE — Telephone Encounter (Signed)
Patient notified

## 2023-10-29 ENCOUNTER — Ambulatory Visit: Payer: BC Managed Care – PPO | Admitting: Family Medicine

## 2023-10-30 ENCOUNTER — Ambulatory Visit: Payer: BC Managed Care – PPO | Admitting: Family Medicine

## 2024-02-04 ENCOUNTER — Encounter: Payer: Self-pay | Admitting: Family Medicine

## 2024-02-04 ENCOUNTER — Ambulatory Visit: Payer: 59 | Admitting: Family Medicine

## 2024-02-04 VITALS — BP 110/66 | HR 96 | Temp 97.9°F | Ht 74.0 in | Wt 277.0 lb

## 2024-02-04 DIAGNOSIS — Z1211 Encounter for screening for malignant neoplasm of colon: Secondary | ICD-10-CM

## 2024-02-04 DIAGNOSIS — F419 Anxiety disorder, unspecified: Secondary | ICD-10-CM | POA: Diagnosis not present

## 2024-02-04 DIAGNOSIS — Z130 Encounter for screening for diseases of the blood and blood-forming organs and certain disorders involving the immune mechanism: Secondary | ICD-10-CM

## 2024-02-04 DIAGNOSIS — I10 Essential (primary) hypertension: Secondary | ICD-10-CM

## 2024-02-04 DIAGNOSIS — E785 Hyperlipidemia, unspecified: Secondary | ICD-10-CM | POA: Diagnosis not present

## 2024-02-04 DIAGNOSIS — Z125 Encounter for screening for malignant neoplasm of prostate: Secondary | ICD-10-CM

## 2024-02-04 MED ORDER — AMLODIPINE BESYLATE 2.5 MG PO TABS
2.5000 mg | ORAL_TABLET | Freq: Every day | ORAL | 3 refills | Status: AC
Start: 2024-02-04 — End: ?

## 2024-02-04 MED ORDER — ENALAPRIL MALEATE 10 MG PO TABS
ORAL_TABLET | ORAL | 3 refills | Status: AC
Start: 2024-02-04 — End: ?

## 2024-02-04 MED ORDER — CITALOPRAM HYDROBROMIDE 40 MG PO TABS
40.0000 mg | ORAL_TABLET | Freq: Every day | ORAL | 3 refills | Status: AC
Start: 2024-02-04 — End: ?

## 2024-02-04 MED ORDER — LORAZEPAM 0.5 MG PO TABS
ORAL_TABLET | ORAL | 5 refills | Status: DC
Start: 2024-02-04 — End: 2024-08-13

## 2024-02-04 NOTE — Assessment & Plan Note (Signed)
 Stable on Celexa and as needed lorazepam.  Refilled today.

## 2024-02-04 NOTE — Patient Instructions (Signed)
 Labs today.  Cologuard ordered.  Meds refilled.  Follow up in 6 months.

## 2024-02-04 NOTE — Progress Notes (Signed)
 Subjective:  Patient ID: Martin Sellers, male    DOB: 01/01/1976  Age: 48 y.o. MRN: [id number]  CC:   Chief Complaint  Patient presents with   Medication Refill    HPI:  48 year old male presents for follow-up.  Hypertension is well-controlled on amlodipine and enalapril.  Needs refills.  Patient reports that overall he is doing well.  He does report stress related to his job.  Anxiety stable on as needed lorazepam and Celexa.  Patient is in need of labs.  He is amenable to getting these today.  Patient is in need of colon cancer screening.  He is amendable to Cologuard.   Patient Active Problem List   Diagnosis Date Noted   Hyperlipidemia 03/29/2023   Insomnia 01/07/2015   Perennial allergic rhinitis 10/27/2013   Fatty liver 10/27/2013   Hypertension    Anxiety     Social Hx   Social History   Socioeconomic History   Marital status: Divorced    Spouse name: Not on file   Number of children: Not on file   Years of education: Not on file   Highest education level: Not on file  Occupational History   Not on file  Tobacco Use   Smoking status: Never   Smokeless tobacco: Never  Substance and Sexual Activity   Alcohol use: No   Drug use: No   Sexual activity: Not on file  Other Topics Concern   Not on file  Social History Narrative   Not on file   Social Drivers of Health   Financial Resource Strain: Not on file  Food Insecurity: Not on file  Transportation Needs: Not on file  Physical Activity: Not on file  Stress: Not on file  Social Connections: Not on file    Review of Systems Per HPI  Objective:  BP 110/66   Pulse 96   Temp 97.9 F (36.6 C)   Ht 6\' 2"  (1.88 m)   Wt 277 lb (125.6 kg)   SpO2 100%   BMI 35.56 kg/m      02/04/2024   11:08 AM 03/29/2023    8:48 AM 03/29/2023    8:31 AM  BP/Weight  Systolic BP 110 122 132  Diastolic BP 66 84 96  Wt. (Lbs) 277  275  BMI 35.56 kg/m2  35.31 kg/m2    Physical Exam Vitals and nursing note  reviewed.  Constitutional:      General: He is not in acute distress.    Appearance: Normal appearance.  HENT:     Head: Normocephalic and atraumatic.  Eyes:     General:        Right eye: No discharge.        Left eye: No discharge.     Conjunctiva/sclera: Conjunctivae normal.  Cardiovascular:     Rate and Rhythm: Normal rate and regular rhythm.  Pulmonary:     Effort: Pulmonary effort is normal.     Breath sounds: Normal breath sounds. No wheezing, rhonchi or rales.  Neurological:     Mental Status: He is alert.  Psychiatric:        Mood and Affect: Mood normal.        Behavior: Behavior normal.     Lab Results  Component Value Date   WBC 3.7 05/24/2022   HGB 16.5 05/24/2022   HCT 47.4 05/24/2022   PLT 276 05/24/2022   GLUCOSE 97 05/24/2022   CHOL 214 (H) 05/24/2022   TRIG 81 05/24/2022   HDL 70  05/24/2022   LDLCALC 130 (H) 05/24/2022   ALT 43 05/24/2022   AST 34 05/24/2022   NA 140 05/24/2022   K 5.1 05/24/2022   CL 98 05/24/2022   CREATININE 0.93 05/24/2022   BUN 11 05/24/2022   CO2 26 05/24/2022     Assessment & Plan:  Primary hypertension Assessment & Plan: Stable.  Continue current medication.  Medications refilled.  Labs today.  Orders: -     amLODIPine Besylate; Take 1 tablet (2.5 mg total) by mouth at bedtime.  Dispense: 90 tablet; Refill: 3 -     Enalapril Maleate; TAKE (1) TABLET BY MOUTH ONCE DAILY.  Dispense: 90 tablet; Refill: 3 -     CMP14+EGFR -     Microalbumin / creatinine urine ratio  Anxiety Assessment & Plan: Stable on Celexa and as needed lorazepam.  Refilled today.  Orders: -     Citalopram Hydrobromide; Take 1 tablet (40 mg total) by mouth daily.  Dispense: 90 tablet; Refill: 3 -     LORazepam; TAKE (1) TABLET BY MOUTH ONCE DAILY AS NEEDED.  Dispense: 30 tablet; Refill: 5  Hyperlipidemia, unspecified hyperlipidemia type Assessment & Plan: Lipid panel today to assess.  Orders: -     Lipid panel  Screening for deficiency  anemia -     CBC  Screening PSA (prostate specific antigen) -     PSA  Colon cancer screening -     Cologuard   Follow-up:  Return in about 6 months (around 08/03/2024).  Everlene Other DO [facility]

## 2024-02-04 NOTE — Assessment & Plan Note (Signed)
Lipid panel today to assess. 

## 2024-02-04 NOTE — Assessment & Plan Note (Signed)
 Stable.  Continue current medication.  Medications refilled.  Labs today.

## 2024-08-04 ENCOUNTER — Ambulatory Visit: Payer: 59 | Admitting: Family Medicine

## 2024-08-13 ENCOUNTER — Other Ambulatory Visit: Payer: Self-pay | Admitting: Family Medicine

## 2024-08-13 DIAGNOSIS — F419 Anxiety disorder, unspecified: Secondary | ICD-10-CM

## 2024-08-20 ENCOUNTER — Ambulatory Visit: Admitting: Family Medicine

## 2024-08-20 VITALS — BP 138/88 | HR 66 | Ht 74.0 in | Wt 270.0 lb

## 2024-08-20 DIAGNOSIS — Z125 Encounter for screening for malignant neoplasm of prostate: Secondary | ICD-10-CM

## 2024-08-20 DIAGNOSIS — E785 Hyperlipidemia, unspecified: Secondary | ICD-10-CM

## 2024-08-20 DIAGNOSIS — I10 Essential (primary) hypertension: Secondary | ICD-10-CM

## 2024-08-20 DIAGNOSIS — F419 Anxiety disorder, unspecified: Secondary | ICD-10-CM

## 2024-08-20 DIAGNOSIS — Z130 Encounter for screening for diseases of the blood and blood-forming organs and certain disorders involving the immune mechanism: Secondary | ICD-10-CM

## 2024-08-20 MED ORDER — LORAZEPAM 0.5 MG PO TABS: ORAL_TABLET | ORAL | 3 refills | Status: DC

## 2024-08-20 NOTE — Patient Instructions (Signed)
 Labs ordered   Follow up in 6 months

## 2024-08-21 ENCOUNTER — Ambulatory Visit: Payer: Self-pay | Admitting: Family Medicine

## 2024-08-21 LAB — MICROALBUMIN / CREATININE URINE RATIO
Creatinine, Urine: 86.7 mg/dL
Microalb/Creat Ratio: <3 mg/g{creat} (ref 0–29)
Microalbumin, Urine: <3 ug/mL

## 2024-08-21 LAB — CMP14+EGFR
ALT: 79 IU/L — ABNORMAL HIGH (ref 0–44)
AST: 60 IU/L — ABNORMAL HIGH (ref 0–40)
Albumin: 4.9 g/dL (ref 4.1–5.1)
Alkaline Phosphatase: 68 IU/L (ref 44–121)
BUN/Creatinine Ratio: 11 (ref 9–20)
BUN: 11 mg/dL (ref 6–24)
Bilirubin Total: 1.3 mg/dL — ABNORMAL HIGH (ref 0.0–1.2)
CO2: 23 mmol/L (ref 20–29)
Calcium: 9.9 mg/dL (ref 8.7–10.2)
Chloride: 96 mmol/L (ref 96–106)
Creatinine, Ser: 1 mg/dL (ref 0.76–1.27)
Globulin, Total: 2.9 g/dL (ref 1.5–4.5)
Glucose: 84 mg/dL (ref 70–99)
Potassium: 4.9 mmol/L (ref 3.5–5.2)
Sodium: 137 mmol/L (ref 134–144)
Total Protein: 7.8 g/dL (ref 6.0–8.5)
eGFR: 93 mL/min/1.73 (ref >59)

## 2024-08-21 LAB — CBC
Hematocrit: 50.8 % (ref 37.5–51.0)
Hemoglobin: 17.4 g/dL (ref 13.0–17.7)
MCH: 33.5 pg — ABNORMAL HIGH (ref 26.6–33.0)
MCHC: 34.3 g/dL (ref 31.5–35.7)
MCV: 98 fL — ABNORMAL HIGH (ref 79–97)
Platelets: 293 x10E3/uL (ref 150–450)
RBC: 5.2 x10E6/uL (ref 4.14–5.80)
RDW: 12.1 % (ref 11.6–15.4)
WBC: 4.2 x10E3/uL (ref 3.4–10.8)

## 2024-08-21 LAB — LIPID PANEL
Chol/HDL Ratio: 3.3 ratio (ref 0.0–5.0)
Cholesterol, Total: 228 mg/dL — ABNORMAL HIGH (ref 100–199)
HDL: 69 mg/dL (ref >39)
LDL Chol Calc (NIH): 142 mg/dL — ABNORMAL HIGH (ref 0–99)
Triglycerides: 98 mg/dL (ref 0–149)
VLDL Cholesterol Cal: 17 mg/dL (ref 5–40)

## 2024-08-21 LAB — PSA: Prostate Specific Ag, Serum: 2.5 ng/mL (ref 0.0–4.0)

## 2024-08-21 NOTE — Assessment & Plan Note (Signed)
 Stable. Continue Amlodipine  and Enalapril .

## 2024-08-21 NOTE — Assessment & Plan Note (Signed)
 Stable.  Continue current medications.

## 2024-08-21 NOTE — Progress Notes (Signed)
 Subjective:  Patient ID: Martin Sellers, male    DOB: [birth year]  Age: 48 y.o. MRN: [id number]  CC:   Chief Complaint  Patient presents with   Medical Management of Chronic Issues    HPI:  48 year old male presents for follow-up.  Patient's hypertension stable on amlodipine  and enalapril .  Anxiety stable on citalopram  and Ativan .  He states that he is feeling well.  Declines flu vaccine today.  He has Cologuard at home.  Advised to complete.   Patient Active Problem List   Diagnosis Date Noted   Hyperlipidemia 03/29/2023   Insomnia 01/07/2015   Perennial allergic rhinitis 10/27/2013   Fatty liver 10/27/2013   Hypertension    Anxiety     Social Hx   Social History   Socioeconomic History   Marital status: Divorced    Spouse name: Not on file   Number of children: Not on file   Years of education: Not on file   Highest education level: Not on file  Occupational History   Not on file  Tobacco Use   Smoking status: Never   Smokeless tobacco: Never  Substance and Sexual Activity   Alcohol use: No   Drug use: No   Sexual activity: Not on file  Other Topics Concern   Not on file  Social History Narrative   Not on file   Social Drivers of Health   Financial Resource Strain: Not on file  Food Insecurity: Not on file  Transportation Needs: Not on file  Physical Activity: Not on file  Stress: Not on file  Social Connections: Not on file    Review of Systems  Respiratory: Negative.    Cardiovascular: Negative.    Objective:  BP 138/88   Pulse 66   Ht 6' 2 (1.88 m)   Wt 270 lb (122.5 kg)   SpO2 98%   BMI 34.67 kg/m      08/20/2024   10:58 AM 02/04/2024   11:08 AM 03/29/2023    8:48 AM  BP/Weight  Systolic BP 138 110 122  Diastolic BP 88 66 84  Wt. (Lbs) 270 277   BMI 34.67 kg/m2 35.56 kg/m2     Physical Exam Vitals and nursing note reviewed.  Constitutional:      General: He is not in acute distress.    Appearance: Normal appearance.  HENT:      Head: Normocephalic and atraumatic.  Cardiovascular:     Rate and Rhythm: Normal rate and regular rhythm.  Pulmonary:     Effort: Pulmonary effort is normal.     Breath sounds: Normal breath sounds. No wheezing, rhonchi or rales.  Neurological:     Mental Status: He is alert.  Psychiatric:        Mood and Affect: Mood normal.        Behavior: Behavior normal.     Lab Results  Component Value Date   WBC 4.2 08/20/2024   HGB 17.4 08/20/2024   HCT 50.8 08/20/2024   PLT 293 08/20/2024   GLUCOSE 84 08/20/2024   CHOL 228 (H) 08/20/2024   TRIG 98 08/20/2024   HDL 69 08/20/2024   LDLCALC 142 (H) 08/20/2024   ALT 79 (H) 08/20/2024   AST 60 (H) 08/20/2024   NA 137 08/20/2024   K 4.9 08/20/2024   CL 96 08/20/2024   CREATININE 1.00 08/20/2024   BUN 11 08/20/2024   CO2 23 08/20/2024     Assessment & Plan:  Primary hypertension Assessment & Plan: Stable.  Continue Amlodipine  and Enalapril .   Orders: -     CMP14+EGFR -     Microalbumin / creatinine urine ratio  Anxiety Assessment & Plan: Stable. Continue current medications.   Orders: -     LORazepam ; TAKE (1) TABLET BY MOUTH ONCE DAILY AS NEEDED.  Dispense: 30 tablet; Refill: 3  Hyperlipidemia, unspecified hyperlipidemia type Assessment & Plan: Rechecking lipid panel.   Orders: -     Lipid panel  Screening for deficiency anemia -     CBC  Screening PSA (prostate specific antigen) -     PSA    Follow-up:  6 months  Merari Pion Bluford DO [facility]

## 2024-08-21 NOTE — Assessment & Plan Note (Signed)
Rechecking lipid panel 

## 2024-08-22 NOTE — Telephone Encounter (Signed)
 Spoke with patient and he is willing to start a medicaion for his LDL.

## 2024-08-22 NOTE — Telephone Encounter (Signed)
 Left message for patient to call back to discuss results and recommendations.  Ok for E2C2 to give results.

## 2024-08-22 NOTE — Telephone Encounter (Signed)
-----   Message from Jacqulyn KANDICE Ahle sent at 08/21/2024  8:11 PM EDT ----- Elevated LFTs. If he drinks alcohol, recommend decrease. LDL 142. Low risk currently. Consider treatment.  ----- Message ----- From: Rebecka Memos Lab Results In Sent: 08/21/2024   7:38 AM EDT To: Jayce G Cook, DO

## 2024-08-24 ENCOUNTER — Other Ambulatory Visit: Payer: Self-pay | Admitting: Family Medicine

## 2024-08-24 MED ORDER — ATORVASTATIN CALCIUM 40 MG PO TABS: 40.0000 mg | ORAL_TABLET | Freq: Every day | ORAL | 3 refills | Status: AC

## 2024-12-09 ENCOUNTER — Other Ambulatory Visit: Payer: Self-pay | Admitting: Family Medicine

## 2024-12-09 DIAGNOSIS — F419 Anxiety disorder, unspecified: Secondary | ICD-10-CM

## 2025-02-17 ENCOUNTER — Ambulatory Visit: Admitting: Family Medicine
